# Patient Record
Sex: Male | Born: 1970 | Race: White | Hispanic: No | Marital: Single | Smoking: Never smoker
Health system: Southern US, Community
[De-identification: ages and names within clinical notes are randomized; demographics above are authoritative.]

## PROBLEM LIST (undated history)

## (undated) DIAGNOSIS — Z789 Other specified health status: Secondary | ICD-10-CM

## (undated) HISTORY — PX: NO PAST SURGERIES: SHX2092

---

## 2004-10-04 ENCOUNTER — Emergency Department: Payer: Self-pay | Admitting: Emergency Medicine

## 2017-07-26 ENCOUNTER — Emergency Department: Payer: Medicaid Other

## 2017-07-26 ENCOUNTER — Other Ambulatory Visit: Payer: Self-pay

## 2017-07-26 ENCOUNTER — Observation Stay
Admission: EM | Admit: 2017-07-26 | Discharge: 2017-07-28 | Disposition: A | Discharge status: Home / Self Care | Payer: Medicaid Other | Attending: Internal Medicine | Admitting: Internal Medicine

## 2017-07-26 ENCOUNTER — Encounter: Payer: Self-pay | Admitting: Emergency Medicine

## 2017-07-26 DIAGNOSIS — L03114 Cellulitis of left upper limb: Secondary | ICD-10-CM

## 2017-07-26 DIAGNOSIS — F17200 Nicotine dependence, unspecified, uncomplicated: Secondary | ICD-10-CM | POA: Insufficient documentation

## 2017-07-26 DIAGNOSIS — L03119 Cellulitis of unspecified part of limb: Secondary | ICD-10-CM

## 2017-07-26 DIAGNOSIS — L02414 Cutaneous abscess of left upper limb: Secondary | ICD-10-CM | POA: Diagnosis not present

## 2017-07-26 DIAGNOSIS — L02419 Cutaneous abscess of limb, unspecified: Secondary | ICD-10-CM | POA: Diagnosis present

## 2017-07-26 DIAGNOSIS — L0291 Cutaneous abscess, unspecified: Secondary | ICD-10-CM

## 2017-07-26 HISTORY — DX: Other specified health status: Z78.9

## 2017-07-26 LAB — BASIC METABOLIC PANEL
ANION GAP: 8 (ref 5–15)
BUN: 9 mg/dL (ref 6–20)
CALCIUM: 9.3 mg/dL (ref 8.9–10.3)
CO2: 28 mmol/L (ref 22–32)
Chloride: 100 mmol/L — ABNORMAL LOW (ref 101–111)
Creatinine, Ser: 0.83 mg/dL (ref 0.61–1.24)
Glucose, Bld: 91 mg/dL (ref 65–99)
Potassium: 4 mmol/L (ref 3.5–5.1)
Sodium: 136 mmol/L (ref 135–145)

## 2017-07-26 LAB — LACTIC ACID, PLASMA
Lactic Acid, Venous: 1.2 mmol/L (ref 0.5–1.9)
Lactic Acid, Venous: 1.7 mmol/L (ref 0.5–1.9)

## 2017-07-26 LAB — CBC WITH DIFFERENTIAL/PLATELET
BASOS ABS: 0 10*3/uL (ref 0–0.1)
BASOS PCT: 0 %
Eosinophils Absolute: 0.1 10*3/uL (ref 0–0.7)
Eosinophils Relative: 1 %
HCT: 44.9 % (ref 40.0–52.0)
Hemoglobin: 14.9 g/dL (ref 13.0–18.0)
Lymphocytes Relative: 17 %
Lymphs Abs: 1.9 10*3/uL (ref 1.0–3.6)
MCH: 31 pg (ref 26.0–34.0)
MCHC: 33.1 g/dL (ref 32.0–36.0)
MCV: 93.6 fL (ref 80.0–100.0)
MONO ABS: 1.4 10*3/uL — AB (ref 0.2–1.0)
Monocytes Relative: 12 %
NEUTROS PCT: 70 %
Neutro Abs: 8.1 10*3/uL — ABNORMAL HIGH (ref 1.4–6.5)
Platelets: 240 10*3/uL (ref 150–440)
RBC: 4.79 MIL/uL (ref 4.40–5.90)
RDW: 13.5 % (ref 11.5–14.5)
WBC: 11.5 10*3/uL — AB (ref 3.8–10.6)

## 2017-07-26 LAB — PROTIME-INR
INR: 0.8
PROTHROMBIN TIME: 11 s — AB (ref 11.4–15.2)

## 2017-07-26 LAB — FIBRINOGEN: FIBRINOGEN: 408 mg/dL (ref 210–475)

## 2017-07-26 LAB — APTT: aPTT: 32 seconds (ref 24–36)

## 2017-07-26 MED ORDER — SODIUM CHLORIDE 0.9 % IV BOLUS
1000.0000 mL | Freq: Once | INTRAVENOUS | Status: AC
  Administered 2017-07-26: 1000 mL via INTRAVENOUS

## 2017-07-26 MED ORDER — FENTANYL CITRATE (PF) 100 MCG/2ML IJ SOLN
50.0000 ug | Freq: Once | INTRAMUSCULAR | Status: AC
Start: 2017-07-26 — End: 2017-07-26
  Administered 2017-07-26: 50 ug via INTRAVENOUS
  Filled 2017-07-26: qty 2

## 2017-07-26 MED ORDER — CLINDAMYCIN PHOSPHATE 600 MG/50ML IV SOLN
600.0000 mg | Freq: Once | INTRAVENOUS | Status: AC
  Administered 2017-07-26: 600 mg via INTRAVENOUS
  Filled 2017-07-26 (×2): qty 50

## 2017-07-26 MED ORDER — TETANUS-DIPHTH-ACELL PERTUSSIS 5-2.5-18.5 LF-MCG/0.5 IM SUSP
0.5000 mL | Freq: Once | INTRAMUSCULAR | Status: AC
  Administered 2017-07-27: 0.5 mL via INTRAMUSCULAR
  Filled 2017-07-26: qty 0.5

## 2017-07-26 MED ORDER — MORPHINE SULFATE (PF) 4 MG/ML IV SOLN
4.0000 mg | Freq: Once | INTRAVENOUS | Status: AC
  Administered 2017-07-26: 4 mg via INTRAVENOUS
  Filled 2017-07-26: qty 1

## 2017-07-26 MED ORDER — LIDOCAINE HCL (PF) 1 % IJ SOLN
INTRAMUSCULAR | Status: AC
  Filled 2017-07-26: qty 5

## 2017-07-26 MED ORDER — ONDANSETRON HCL 4 MG/2ML IJ SOLN
4.0000 mg | Freq: Once | INTRAMUSCULAR | Status: AC
  Administered 2017-07-26: 4 mg via INTRAVENOUS
  Filled 2017-07-26: qty 2

## 2017-07-26 MED ORDER — LIDOCAINE HCL (PF) 1 % IJ SOLN
10.0000 mL | Freq: Once | INTRAMUSCULAR | Status: AC
  Administered 2017-07-26: 10 mL via INTRADERMAL

## 2017-07-26 NOTE — ED Triage Notes (Signed)
FIRST NURSE NOTE-here for wound/redness to left wrist.  Has red streak up to elbow.  Not had evaluated.  Has been tired today.

## 2017-07-26 NOTE — ED Provider Notes (Signed)
[facility] Emergency Department Provider Note  ____________________________________________  Time seen: Approximately 10:37 PM  I have reviewed the triage vital signs and the nursing notes.   HISTORY  Chief Complaint Insect Bite   HPI Michael [name] is a 47 y.o. male no significant past medical history who presents for evaluation of left upper extremity pain and swelling.  Patient reports that he was clearing the bushes around his house yesterday.  Later that evening he noted some swelling over the left wrist area.  Since then the area developed into an abscess.  He tried to drained at home yesterday. The swelling and erythema then started to spread down his hand and up his arm. Patient is complaining of constant severe swelling and moderate throbbing pain since earlier this morning. Patient has demarcated the redness area at home and upon arrival to the ED, the redness has spread significantly. The are was again demarcated in the waiting room and has spread before he was placed in the room.  Patient is right-handed.  He denies any known insect or snake bites.  He denies any fever, chills, nausea, vomiting.  PMH None  Meds None  Allergies Patient has no known allergies.  FH Not contributory  Social History Social History   Tobacco Use  . Smoking status: Current Some Day Smoker  . Smokeless tobacco: Never Used  Substance Use Topics  . Alcohol use: Not on file  . Drug use: Not on file    Review of Systems  Constitutional: Negative for fever. Eyes: Negative for visual changes. ENT: Negative for sore throat. Neck: No neck pain  Cardiovascular: Negative for chest pain. Respiratory: Negative for shortness of breath. Gastrointestinal: Negative for abdominal pain, vomiting or diarrhea. Genitourinary: Negative for dysuria. Musculoskeletal: Negative for back pain. + L arm swelling and pain Skin: Negative for rash. Neurological: Negative for  headaches, weakness or numbness. Psych: No SI or HI  ____________________________________________   PHYSICAL EXAM:  VITAL SIGNS: ED Triage Vitals [07/26/17 1935]  Enc Vitals Group     BP (!) 152/100     Pulse Rate 61     Resp 18     Temp 98.9 F (37.2 C)     Temp Source Oral     SpO2 99 %     Weight      Height      Head Circumference      Peak Flow      Pain Score 10     Pain Loc      Pain Edu?      Excl. in GC?     Constitutional: Alert and oriented. Well appearing and in no apparent distress. HEENT:      Head: Normocephalic and atraumatic.         Eyes: Conjunctivae are normal. Sclera is non-icteric.       Mouth/Throat: Mucous membranes are moist.       Neck: Supple with no signs of meningismus. Cardiovascular: Regular rate and rhythm. No murmurs, gallops, or rubs. 2+ symmetrical distal pulses are present in all extremities. No JVD. Respiratory: Normal respiratory effort. Lungs are clear to auscultation bilaterally. No wheezes, crackles, or rhonchi.  Gastrointestinal: Soft, non tender, and non distended with positive bowel sounds. No rebound or guarding. Musculoskeletal: There is an abscess located on the L wrist with significant overlying erythema spreading to the hand and up his arm. There is swelling of the dorsum of the hand Neurologic: Normal speech and language. Face is  symmetric. Moving all extremities. No gross focal neurologic deficits are appreciated. Skin: Skin is warm, dry and intact. No rash noted. Psychiatric: Mood and affect are normal. Speech and behavior are normal.  ____________________________________________   LABS (all labs ordered are listed, but only abnormal results are displayed)  Labs Reviewed  CBC WITH DIFFERENTIAL/PLATELET - Abnormal; Notable for the following components:      Result Value   WBC 11.5 (*)    Neutro Abs 8.1 (*)    Monocytes Absolute 1.4 (*)    All other components within normal limits  BASIC METABOLIC PANEL -  Abnormal; Notable for the following components:   Chloride 100 (*)    All other components within normal limits  PROTIME-INR - Abnormal; Notable for the following components:   Prothrombin Time 11.0 (*)    All other components within normal limits  CULTURE, BLOOD (ROUTINE X 2)  CULTURE, BLOOD (ROUTINE X 2)  LACTIC ACID, PLASMA  LACTIC ACID, PLASMA  APTT  FIBRINOGEN   ____________________________________________  EKG  none  ____________________________________________  RADIOLOGY  I have personally reviewed the images performed during this visit and I agree with the Radiologist's read.   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 EXAM: LEFT WRIST - COMPLETE 3+ VIEW COMPARISON:  None. FINDINGS: There is no evidence of fracture or dislocation. There is no evidence of arthropathy or other focal bone abnormality. Mild swelling of the wrist soft tissues. IMPRESSION: No acute bony or articular abnormality identified. Mild swelling of the wrist soft tissues. Electronically Signed   By: Mitzi HansenLance  Furusawa-Stratton M.D.   On: 07/26/2017 22:25   ____________________________________________   PROCEDURES  Procedure(s) performed:yes .Marland Kitchen.Incision and Drainage Date/Time: 07/26/2017 10:53 PM Performed by: Nita SickleVeronese, [place], MD Authorized by: Nita SickleVeronese, [place], MD   Consent:    Consent obtained:  Verbal   Consent given by:  Patient   Risks discussed:  Bleeding, infection, incomplete drainage and pain   Alternatives discussed:  Alternative treatment, delayed treatment and observation Location:    Type:  Abscess   Size:  1   Location:  Upper extremity   Upper extremity location:  Wrist   Wrist location:  L wrist Pre-procedure details:    Skin preparation:  Chloraprep Anesthesia (see MAR for exact dosages):     Anesthesia method:  Local infiltration   Local anesthetic:  Lidocaine 1% w/o epi Procedure type:    Complexity:  Complex Procedure details:    Incision types:  Stab incision   Scalpel blade:  11   Wound management:  Probed and deloculated and irrigated with saline   Drainage:  Purulent   Drainage amount:  Moderate   Wound treatment:  Wound left open   Packing materials:  None Post-procedure details:    Patient tolerance of procedure:  Tolerated well, no immediate complications   Critical Care performed:  None ____________________________________________   INITIAL IMPRESSION / ASSESSMENT AND PLAN / ED COURSE   47 y.o. male no significant past medical history who presents for evaluation of left upper extremity pain and swelling.  Patient with an abscess located over the left wrist with significant swelling, warmth, and erythema extending distally to the fingers and proximally to the forearm.  The area has expanded outside the demarcated area which was done in triage upon arrival at 7:30PM. The abscess was I&D per procedure  note above.  No tachycardia or fever, mild leukocytosis with white count of 11.5.  Normal lactic acid.  Blood cultures were sent. Will give IV clindamycin and vancomycin.  At this time since patient is otherwise healthy with normal vitals and normal labs and the abscess has been drained.  I believe patient may be safe for discharge home on oral clindamycin.  However we are still waiting for pharmacy to send antibiotics.  Will reevaluate the wound after antibiotics have been given.  If continues to expand outside of the demarcated area or if patient spikes a fever will get him admitted for IV antibiotics otherwise will plan to discharge home on p.o. antibiotics and close follow-up.    _________________________ 12:11 AM on 07/27/2017 -----------------------------------------  Patient continues to have significant pain after 3 rounds of opiates.  Will admit for pain control,  IV antibiotics, close monitoring.   As part of my medical decision making, I reviewed the following data within the electronic MEDICAL RECORD NUMBER Nursing notes reviewed and incorporated, Labs reviewed , Radiograph reviewed , Discussed with admitting physician , Notes from prior ED visits and [place] Controlled Substance Database    Pertinent labs & imaging results that were available during my care of the patient were reviewed by me and considered in my medical decision making (see chart for details).    ____________________________________________   FINAL CLINICAL IMPRESSION(S) / ED DIAGNOSES  Final diagnoses:  Abscess  Cellulitis of left upper extremity      NEW MEDICATIONS STARTED DURING THIS VISIT:  ED Discharge Orders    None       Note:  This document was prepared using Dragon voice recognition software and may include unintentional dictation errors.    Don Perking, [place], MD 07/27/17 [phone]

## 2017-07-26 NOTE — ED Triage Notes (Signed)
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

## 2017-07-26 NOTE — ED Notes (Signed)
Pharmacy emailed to send cleocin 

## 2017-07-27 ENCOUNTER — Encounter: Payer: Self-pay | Admitting: Internal Medicine

## 2017-07-27 ENCOUNTER — Other Ambulatory Visit: Payer: Self-pay

## 2017-07-27 DIAGNOSIS — L03119 Cellulitis of unspecified part of limb: Secondary | ICD-10-CM

## 2017-07-27 DIAGNOSIS — L02419 Cutaneous abscess of limb, unspecified: Secondary | ICD-10-CM | POA: Diagnosis present

## 2017-07-27 LAB — BASIC METABOLIC PANEL
Anion gap: 7 (ref 5–15)
BUN: 7 mg/dL (ref 6–20)
CHLORIDE: 104 mmol/L (ref 101–111)
CO2: 25 mmol/L (ref 22–32)
Calcium: 8.4 mg/dL — ABNORMAL LOW (ref 8.9–10.3)
Creatinine, Ser: 0.78 mg/dL (ref 0.61–1.24)
GFR calc Af Amer: >60 mL/min (ref >60)
GLUCOSE: 153 mg/dL — AB (ref 65–99)
Potassium: 4 mmol/L (ref 3.5–5.1)
SODIUM: 136 mmol/L (ref 135–145)

## 2017-07-27 LAB — CBC
HCT: 43.4 % (ref 40.0–52.0)
Hemoglobin: 14.5 g/dL (ref 13.0–18.0)
MCH: 31.9 pg (ref 26.0–34.0)
MCHC: 33.4 g/dL (ref 32.0–36.0)
MCV: 95.4 fL (ref 80.0–100.0)
PLATELETS: 212 10*3/uL (ref 150–440)
RBC: 4.55 MIL/uL (ref 4.40–5.90)
RDW: 13.5 % (ref 11.5–14.5)
WBC: 9.8 10*3/uL (ref 3.8–10.6)

## 2017-07-27 MED ORDER — OXYCODONE HCL 5 MG PO TABS
5.0000 mg | ORAL_TABLET | ORAL | Status: DC | PRN
  Administered 2017-07-27 – 2017-07-28 (×3): 5 mg via ORAL
  Filled 2017-07-27 (×3): qty 1

## 2017-07-27 MED ORDER — ACETAMINOPHEN 325 MG PO TABS
650.0000 mg | ORAL_TABLET | Freq: Four times a day (QID) | ORAL | Status: DC | PRN
Start: 2017-07-27 — End: 2017-07-28
  Administered 2017-07-27 – 2017-07-28 (×2): 650 mg via ORAL
  Filled 2017-07-27 (×2): qty 2

## 2017-07-27 MED ORDER — VANCOMYCIN HCL IN DEXTROSE 1-5 GM/200ML-% IV SOLN
1000.0000 mg | Freq: Three times a day (TID) | INTRAVENOUS | Status: DC
  Administered 2017-07-27 – 2017-07-28 (×4): 1000 mg via INTRAVENOUS
  Filled 2017-07-27 (×5): qty 200

## 2017-07-27 MED ORDER — VANCOMYCIN HCL IN DEXTROSE 1-5 GM/200ML-% IV SOLN
1000.0000 mg | Freq: Once | INTRAVENOUS | Status: AC
  Administered 2017-07-27: 1000 mg via INTRAVENOUS
  Filled 2017-07-27: qty 200

## 2017-07-27 MED ORDER — ACETAMINOPHEN 650 MG RE SUPP: 650.0000 mg | Freq: Four times a day (QID) | RECTAL | Status: DC | PRN

## 2017-07-27 MED ORDER — ONDANSETRON HCL 4 MG/2ML IJ SOLN: 4.0000 mg | Freq: Four times a day (QID) | INTRAMUSCULAR | Status: DC | PRN

## 2017-07-27 MED ORDER — MORPHINE SULFATE (PF) 4 MG/ML IV SOLN
4.0000 mg | INTRAVENOUS | Status: DC | PRN
  Administered 2017-07-27 (×5): 4 mg via INTRAVENOUS
  Filled 2017-07-27 (×5): qty 1

## 2017-07-27 MED ORDER — ONDANSETRON HCL 4 MG PO TABS
4.0000 mg | ORAL_TABLET | Freq: Four times a day (QID) | ORAL | Status: DC | PRN
Start: 2017-07-27 — End: 2017-07-28

## 2017-07-27 MED ORDER — ENOXAPARIN SODIUM 40 MG/0.4ML ~~LOC~~ SOLN
40.0000 mg | SUBCUTANEOUS | Status: DC
  Administered 2017-07-27 – 2017-07-28 (×2): 40 mg via SUBCUTANEOUS
  Filled 2017-07-27 (×2): qty 0.4

## 2017-07-27 NOTE — H&P (Signed)
[facility] - [place] at [facility]   PATIENT NAME: Michael [name]    MR#:  [id number]  DATE OF BIRTH:  16-May-1970  DATE OF ADMISSION:  07/26/2017  PRIMARY CARE PHYSICIAN: Patient, No Pcp Per   REQUESTING/REFERRING PHYSICIAN: Don Perking, MD  CHIEF COMPLAINT:   Chief Complaint  Patient presents with  . Insect Bite    HISTORY OF PRESENT ILLNESS:  Michael [name]  is a 47 y.o. male who presents with erythema and swelling to his left upper extremity that started about 36 hours ago.  This progressed until today he had significant pain and tenderness and swelling and came to the ED for evaluation.  ED physician was able to lance an abscess, IV antibiotics started and hospitalist called for admission  PAST MEDICAL HISTORY:   Past Medical History:  Diagnosis Date  . Patient denies medical problems      PAST SURGICAL HISTORY:   Past Surgical History:  Procedure Laterality Date  . NO PAST SURGERIES       SOCIAL HISTORY:   Social History   Tobacco Use  . Smoking status: Current Some Day Smoker  . Smokeless tobacco: Never Used  Substance Use Topics  . Alcohol use: Not on file     FAMILY HISTORY:  Family history reviewed and is noncontributory  DRUG ALLERGIES:  No Known Allergies  MEDICATIONS AT HOME:   Prior to Admission medications   Not on File    REVIEW OF SYSTEMS:  Review of Systems  Constitutional: Negative for chills, fever, malaise/fatigue and weight loss.  HENT: Negative for ear pain, hearing loss and tinnitus.   Eyes: Negative for blurred vision, double vision, pain and redness.  Respiratory: Negative for cough, hemoptysis and shortness of breath.   Cardiovascular: Negative for chest pain, palpitations, orthopnea and leg swelling.  Gastrointestinal: Negative for abdominal pain, constipation, diarrhea, nausea and vomiting.  Genitourinary: Negative for dysuria, frequency and hematuria.  Musculoskeletal: Negative for back  pain, joint pain and neck pain.  Skin:       Erythema, tenderness, abscess left upper extremity  Neurological: Negative for dizziness, tremors, focal weakness and weakness.  Endo/Heme/Allergies: Negative for polydipsia. Does not bruise/bleed easily.  Psychiatric/Behavioral: Negative for depression. The patient is not nervous/anxious and does not have insomnia.      VITAL SIGNS:   Vitals:   07/27/17 0015 07/27/17 0030 07/27/17 0045 07/27/17 0100  BP:  116/73  (!) 129/93  Pulse: 82 81 76 83  Resp:      Temp:      TempSrc:      SpO2: 93% 93% 93% 94%   Wt Readings from Last 3 Encounters:  No data found for Wt    PHYSICAL EXAMINATION:  Physical Exam  Vitals reviewed. Constitutional: He is oriented to person, place, and time. He appears well-developed and well-nourished. No distress.  HENT:  Head: Normocephalic and atraumatic.  Mouth/Throat: Oropharynx is clear and moist.  Eyes: Pupils are equal, round, and reactive to light. Conjunctivae and EOM are normal. No scleral icterus.  Neck: Normal range of motion. Neck supple. No JVD present. No thyromegaly present.  Cardiovascular: Normal rate, regular rhythm and intact distal pulses. Exam reveals no gallop and no friction rub.  No murmur heard. Respiratory: Effort normal and breath sounds normal. No respiratory distress. He has no wheezes. He has no rales.  GI: Soft. Bowel sounds are normal. He exhibits no distension. There is no tenderness.  Musculoskeletal: Normal range of motion. He exhibits no edema.  No arthritis, no gout  Lymphadenopathy:    He has no cervical adenopathy.  Neurological: He is alert and oriented to person, place, and time. No cranial nerve deficit.  No dysarthria, no aphasia  Skin: Skin is warm and dry. No rash noted. There is erythema (Left upper extremity prominent around the hand and tracking up his arm).  Psychiatric: He has a normal mood and affect. His behavior is normal. Judgment and thought content  normal.    LABORATORY PANEL:   CBC Recent Labs  Lab 07/26/17 1946  WBC 11.5*  HGB 14.9  HCT 44.9  PLT 240   ------------------------------------------------------------------------------------------------------------------  Chemistries  Recent Labs  Lab 07/26/17 1946  NA 136  K 4.0  CL 100*  CO2 28  GLUCOSE 91  BUN 9  CREATININE 0.83  CALCIUM 9.3   ------------------------------------------------------------------------------------------------------------------  Cardiac Enzymes No results for input(s): TROPONINI in the last 168 hours. 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 EXAM: LEFT WRIST - COMPLETE 3+ VIEW COMPARISON:  None. FINDINGS: There is no evidence of fracture or dislocation. There is no evidence of arthropathy or other focal bone abnormality. Mild swelling of the wrist soft tissues. IMPRESSION: No acute bony or articular abnormality identified. Mild swelling of the wrist soft tissues. Electronically Signed   By: Mitzi HansenLance  Furusawa-Stratton M.D.   On: 07/26/2017 22:25    EKG:  No orders found for this or any previous visit.  IMPRESSION AND PLAN:  Principal Problem:   Cellulitis and abscess of upper extremity -abscess lanced, IV antibiotics started, PRN analgesia  Chart review performed and case discussed with ED provider. Labs, imaging and/or ECG reviewed by provider and discussed with patient/family. Management plans discussed with the patient and/or family.  DVT PROPHYLAXIS: SubQ lovenox  GI PROPHYLAXIS: None  ADMISSION STATUS: Observation  CODE STATUS: Full  TOTAL TIME TAKING CARE OF THIS PATIENT: 40 minutes.   Michael [name] 07/27/2017, 1:16 AM  [facility]  Office  [phone]  CC: Primary care physician; Patient, No Pcp Per  Note:  This document was prepared using Dragon voice recognition software and may include unintentional dictation errors.

## 2017-07-27 NOTE — Progress Notes (Signed)
Patient seen and evaluated by me today Reviewed history and physical by Dr. Oralia Manisavid Willis Patient currently on IV vancomycin antibiotic had incision and drainage Of the left upper extremity abscess. Continue IV antibiotics continue pain medications with oxycodone and as needed IV morphine Discussed with patient treatment plan and medical condition in detail.

## 2017-07-27 NOTE — Progress Notes (Signed)
Advanced care plan.  Purpose of the Encounter: CODE STATUS  Parties in Attendance: Patient  Patient's Decision Capacity:Good  Subjective/Patient's story: Admitted for cellulitis of fore arm and arm   Objective/Medical story I&D done in ER On IV antibiotics  Goals of care determination:  Advance directives discussed Patient wants everything done : cardiac resuscitation, intubation and ventilator if need arises  CODE STATUS: Full code   Time spent discussing advanced care planning: 16 minutes

## 2017-07-27 NOTE — Progress Notes (Signed)
Pharmacy Antibiotic Note  Michael [name] is a 47 y.o. male admitted on 07/26/2017 with cellulitis.  Pharmacy has been consulted for vancomcyin dosing.  Plan: Patient received vanc 1g IV x 1   Will continue w/ vanc 1g IV q8h w/ 6 hour stack Will draw vanc trough 04/10 @ 0600 prior to 4th dose.  Ke 0.108 T1/2 8 hrs Goal trough 15 - 20 mcg/mL d/t erythema and swelling to LUE s/t insect bite  Height: 6\' 2"  (188 cm) Weight: 186 lb (84.4 kg) IBW/kg (Calculated) : 82.2  Temp (24hrs), Avg:98.8 F (37.1 C), Min:98.7 F (37.1 C), Max:98.9 F (37.2 C)  Recent Labs  Lab 07/26/17 1946 07/26/17 2128 07/27/17 0450  WBC 11.5*  --  9.8  CREATININE 0.83  --  0.78  LATICACIDVEN 1.2 1.7  --     Estimated Creatinine Clearance: 131.3 mL/min (by C-G formula based on SCr of 0.78 mg/dL).    No Known Allergies  Thank you for allowing pharmacy to be a part of this patient's care.  Thomasene Rippleavid Nyair Depaulo, PharmD, BCPS Clinical Pharmacist 07/27/2017

## 2017-07-28 LAB — HIV ANTIBODY (ROUTINE TESTING W REFLEX): HIV SCREEN 4TH GENERATION: NONREACTIVE

## 2017-07-28 LAB — VANCOMYCIN, TROUGH: VANCOMYCIN TR: 12 ug/mL — AB (ref 15–20)

## 2017-07-28 MED ORDER — SODIUM CHLORIDE 0.9 % IV SOLN
1250.0000 mg | Freq: Three times a day (TID) | INTRAVENOUS | Status: DC
  Administered 2017-07-28: 1250 mg via INTRAVENOUS
  Filled 2017-07-28 (×4): qty 1250

## 2017-07-28 MED ORDER — CLINDAMYCIN HCL 300 MG PO CAPS: 300.0000 mg | ORAL_CAPSULE | Freq: Three times a day (TID) | ORAL | 0 refills | Status: AC

## 2017-07-28 MED ORDER — OXYCODONE-ACETAMINOPHEN 5-325 MG PO TABS
1.0000 | ORAL_TABLET | Freq: Four times a day (QID) | ORAL | 0 refills | Status: AC | PRN
Start: 2017-07-28 — End: 2017-08-02

## 2017-07-28 NOTE — Discharge Summary (Signed)
[facility] - [place] at [facility], [place] y.o., DOB [birth year], MRN [id number]. Admission date: 07/26/2017 Discharge Date 07/28/2017 Primary MD Patient, No Pcp Per Admitting Physician Michael Manis, MD  Admission Diagnosis   1.  Cellulitis and abscess of left upper extremity 2.  Left upper extremity pain 3.  Tobacco abuse  Discharge Diagnosis        1.  Cellulitis of the left upper extremity improved 2.  Left upper extremity abscess status post incision and drainage 3.  Tobacco cessation counseled to the patient Hospital Course  47 year old male patient with history of smoking was admitted for left upper extremity swelling and redness.  Patient had left upper extremity cellulitis and abscess.  He had incision and drainage done in the emergency room.  He was put on IV vancomycin antibiotic.  Patient received oral oxycodone for pain.  His HIV test is negative.  Blood cultures did not reveal any growth.  W BC count is normal at the time of discharge.  Patient tolerated IV antibiotics well.  Will be discharged home on oral clindamycin antibiotic.  Patient will continue oral Percocet as needed for pain.  Follow-up with primary care physician in the clinic tobacco cessation counseled to the patient for 6 minutes.  Discharge harmful effects of smoking also explained.  Nicotine patch offered.  Patient was worked up with left upper extremity x-rays which showed no fracture.  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 EXAM: LEFT WRIST - COMPLETE 3+ VIEW COMPARISON:  None. FINDINGS: There is no evidence of fracture or dislocation. There is no evidence of arthropathy or other focal bone abnormality. Mild swelling of the wrist soft tissues. IMPRESSION: No  acute bony or articular abnormality identified. Mild swelling of the wrist soft tissues. Electronically Signed   By: Michael [name] M.D.   On: 07/26/2017 22:25       Today   Subjective:   Michael [name] was seen and evaluated by me on the day of discharge. Decreased pain in the left upper extremity and left forearm and wrist No fever and chills No chest pain No shortness of breath  Objective:   Blood pressure 133/85, pulse 79, temperature 98.7 F (37.1 C), temperature source Oral, resp. rate 19, height 6\' 2"  (1.88 m), weight 84.4 kg (186 lb), SpO2 97 %.  .  Intake/Output Summary (Last 24 hours) at 07/28/2017 1544 Last data filed at 07/28/2017 0027 Gross per 24 hour  Intake 640 ml  Output -  Net 640 ml    Exam VITAL SIGNS: Blood pressure 133/85, pulse 79, temperature 98.7 F (37.1 C), temperature source Oral, resp. rate 19, height 6\' 2"  (1.88 m), weight 84.4 kg (186 lb), SpO2 97 %.  GENERAL:  47 y.o.-year-old patient lying in the bed with no acute distress.  EYES: Pupils equal, round, reactive to light and accommodation. No scleral icterus. Extraocular muscles intact.  HEENT: Head atraumatic, normocephalic. Oropharynx and nasopharynx clear.  NECK:  Supple, no jugular venous distention. No thyroid enlargement, no tenderness.  LUNGS: Normal breath sounds bilaterally, no wheezing, rales,rhonchi or crepitation. No use of accessory muscles of respiration.  CARDIOVASCULAR: S1, S2 normal. No murmurs, rubs, or gallops.  ABDOMEN: Soft, nontender, nondistended. Bowel sounds present. No organomegaly or  mass.  EXTREMITIES: No pedal edema, cyanosis, or clubbing.  Decreased redness in the left upper extremity Decreased swelling and tenderness in the left upper extremity around the wrist and left forearm Range of motion intact in the left wrist elbow NEUROLOGIC: Cranial nerves II through XII are intact. Muscle strength 5/5 in all extremities. Sensation intact. Gait not checked.   PSYCHIATRIC: The patient is alert and oriented x 3.  SKIN: No obvious rash, lesion, or ulcer.   Data Review     CBC w Diff:  Lab Results  Component Value Date   WBC 9.8 07/27/2017   HGB 14.5 07/27/2017   HCT 43.4 07/27/2017   PLT 212 07/27/2017   LYMPHOPCT 17 07/26/2017   MONOPCT 12 07/26/2017   EOSPCT 1 07/26/2017   BASOPCT 0 07/26/2017   CMP:  Lab Results  Component Value Date   NA 136 07/27/2017   K 4.0 07/27/2017   CL 104 07/27/2017   CO2 25 07/27/2017   BUN 7 07/27/2017   CREATININE 0.78 07/27/2017  .  Micro Results Recent Results (from the past 240 hour(s))  Blood culture (routine x 2)     Status: None (Preliminary result)   Collection Time: 07/26/17  7:46 PM  Result Value Ref Range Status   Specimen Description BLOOD RIGHT ANTECUBITAL  Final   Special Requests   Final    BOTTLES DRAWN AEROBIC AND ANAEROBIC Blood Culture results may not be optimal due to an excessive volume of blood received in culture bottles   Culture   Final    NO GROWTH 2 DAYS Performed at [facility], [address] [place], [place] [address]    Report Status PENDING  Incomplete  Blood culture (routine x 2)     Status: None (Preliminary result)   Collection Time: 07/26/17  9:28 PM  Result Value Ref Range Status   Specimen Description BLOOD BLOOD RIGHT FOREARM  Final   Special Requests   Final    BOTTLES DRAWN AEROBIC AND ANAEROBIC Blood Culture results may not be optimal due to an excessive volume of blood received in culture bottles   Culture   Final    NO GROWTH 2 DAYS Performed at [facility], [address], [place], [place] [address]    Report Status PENDING  Incomplete        Code Status Orders  (From admission, onward)        Start     Ordered   07/27/17 0227  Full code  Continuous     07/27/17 0226    Code Status History    This patient has a current code status but no historical code status.          Follow-up Information     Center, [facility] In 1 week.   Specialty:  General Practice Why:  Patient to Call for Appt Contact information: [address] [place] [address] [phone]           Discharge Medications   Allergies as of 07/28/2017   No Known Allergies     Medication List    TAKE these medications   clindamycin 300 MG capsule Commonly known as:  CLEOCIN Take 1 capsule (300 mg total) by mouth 3 (three) times daily for 7 days.   oxyCODONE-acetaminophen 5-325 MG tablet Commonly known as:  PERCOCET Take 1 tablet by mouth every 6 (six) hours as needed for up to 5 days for severe pain.  Total Time in preparing paper work, data evaluation and todays exam - 35 minutes  Ihor AustinPavan George Haggart M.D on 07/28/2017 at 3:44 PM [facility]   Office  [phone]

## 2017-07-28 NOTE — Progress Notes (Signed)
Pharmacy Antibiotic Note  Michael LovingsRobert J [name] is a 47 y.o. male admitted on 07/26/2017 with cellulitis.  Pharmacy has been consulted for vancomcyin dosing.  Plan: Patient received vanc 1g IV x 1   Will continue w/ vanc 1g IV q8h w/ 6 hour stack Will draw vanc trough 04/10 @ 0600 prior to 4th dose.  Ke 0.108 T1/2 8 hrs Goal trough 15 - 20 mcg/mL d/t erythema and swelling to LUE s/t insect bite  04/10 @ 0500 VT 12 subtherapeutic from goal. Will increase dose to vanc 1.25g IV q8h to start @ 1500, will recheck VT @ 04/11 @ 1400 prior to 4th dose. Css 15 mcg/mL  Height: 6\' 2"  (188 cm) Weight: 186 lb (84.4 kg) IBW/kg (Calculated) : 82.2  Temp (24hrs), Avg:99.8 F (37.7 C), Min:98.8 F (37.1 C), Max:101 F (38.3 C)  Recent Labs  Lab 07/26/17 1946 07/26/17 2128 07/27/17 0450 07/28/17 0510  WBC 11.5*  --  9.8  --   CREATININE 0.83  --  0.78  --   LATICACIDVEN 1.2 1.7  --   --   VANCOTROUGH  --   --   --  12*    Estimated Creatinine Clearance: 131.3 mL/min (by C-G formula based on SCr of 0.78 mg/dL).    No Known Allergies  Thank you for allowing pharmacy to be a part of this patient's care.  Thomasene Rippleavid Kaylum Shrum, PharmD, BCPS Clinical Pharmacist 07/28/2017

## 2017-07-28 NOTE — Progress Notes (Signed)
Patient verbalized understanding of discharge instructions and received all prescriptions. Pt ambulatory and afebrile at discharge.

## 2017-07-31 LAB — CULTURE, BLOOD (ROUTINE X 2)
CULTURE: NO GROWTH
Culture: NO GROWTH

## 2018-01-30 ENCOUNTER — Emergency Department
Admission: EM | Admit: 2018-01-30 | Discharge: 2018-01-31 | Disposition: A | Discharge status: Other Acute Inpatient Hospital | Payer: Medicaid Other | Attending: Emergency Medicine | Admitting: Emergency Medicine

## 2018-01-30 ENCOUNTER — Encounter: Payer: Self-pay | Admitting: Emergency Medicine

## 2018-01-30 ENCOUNTER — Other Ambulatory Visit: Payer: Self-pay

## 2018-01-30 DIAGNOSIS — F12188 Cannabis abuse with other cannabis-induced disorder: Secondary | ICD-10-CM | POA: Diagnosis not present

## 2018-01-30 DIAGNOSIS — F17200 Nicotine dependence, unspecified, uncomplicated: Secondary | ICD-10-CM | POA: Diagnosis not present

## 2018-01-30 DIAGNOSIS — F29 Unspecified psychosis not due to a substance or known physiological condition: Secondary | ICD-10-CM

## 2018-01-30 DIAGNOSIS — R440 Auditory hallucinations: Secondary | ICD-10-CM | POA: Insufficient documentation

## 2018-01-30 DIAGNOSIS — F10950 Alcohol use, unspecified with alcohol-induced psychotic disorder with delusions: Secondary | ICD-10-CM | POA: Diagnosis not present

## 2018-01-30 DIAGNOSIS — Z046 Encounter for general psychiatric examination, requested by authority: Secondary | ICD-10-CM | POA: Diagnosis present

## 2018-01-30 LAB — COMPREHENSIVE METABOLIC PANEL
ALBUMIN: 4.7 g/dL (ref 3.5–5.0)
ALT: 19 U/L (ref 0–44)
AST: 25 U/L (ref 15–41)
Alkaline Phosphatase: 59 U/L (ref 38–126)
Anion gap: 7 (ref 5–15)
BUN: 15 mg/dL (ref 6–20)
CO2: 30 mmol/L (ref 22–32)
Calcium: 9.4 mg/dL (ref 8.9–10.3)
Chloride: 102 mmol/L (ref 98–111)
Creatinine, Ser: 0.98 mg/dL (ref 0.61–1.24)
GFR calc Af Amer: >60 mL/min (ref >60)
GFR calc non Af Amer: >60 mL/min (ref >60)
Glucose, Bld: 96 mg/dL (ref 70–99)
POTASSIUM: 4 mmol/L (ref 3.5–5.1)
Sodium: 139 mmol/L (ref 135–145)
Total Bilirubin: 0.6 mg/dL (ref 0.3–1.2)
Total Protein: 7.2 g/dL (ref 6.5–8.1)

## 2018-01-30 LAB — CBC
HEMATOCRIT: 39.6 % (ref 39.0–52.0)
HEMOGLOBIN: 14 g/dL (ref 13.0–17.0)
MCH: 32.9 pg (ref 26.0–34.0)
MCHC: 35.4 g/dL (ref 30.0–36.0)
MCV: 93.2 fL (ref 80.0–100.0)
Platelets: 269 10*3/uL (ref 150–400)
RBC: 4.25 MIL/uL (ref 4.22–5.81)
RDW: 12.5 % (ref 11.5–15.5)
WBC: 8.7 10*3/uL (ref 4.0–10.5)
nRBC: 0 % (ref 0.0–0.2)

## 2018-01-30 LAB — URINE DRUG SCREEN, QUALITATIVE (ARMC ONLY)
AMPHETAMINES, UR SCREEN: POSITIVE — AB
Barbiturates, Ur Screen: NOT DETECTED
Benzodiazepine, Ur Scrn: NOT DETECTED
Cannabinoid 50 Ng, Ur ~~LOC~~: POSITIVE — AB
Cocaine Metabolite,Ur ~~LOC~~: NOT DETECTED
MDMA (ECSTASY) UR SCREEN: NOT DETECTED
METHADONE SCREEN, URINE: NOT DETECTED
Opiate, Ur Screen: NOT DETECTED
PHENCYCLIDINE (PCP) UR S: NOT DETECTED
TRICYCLIC, UR SCREEN: NOT DETECTED

## 2018-01-30 LAB — ETHANOL

## 2018-01-30 LAB — SALICYLATE LEVEL

## 2018-01-30 LAB — ACETAMINOPHEN LEVEL

## 2018-01-30 NOTE — ED Notes (Addendum)
Pt dressed out in burgundy scrubs by Allayne Stack, EDT with this RN and Guido Sander present. Blue T shirt, orange and grey baseball cap, brown belt, blue jeans, boxer briefs taken and placed in belongings bag, labeled and taken to [facility] locker room.

## 2018-01-30 NOTE — ED Notes (Signed)
Patient states he smoked som "weed" and drank some alcohol.

## 2018-01-30 NOTE — ED Provider Notes (Addendum)
[facility] Emergency Department Provider Note  ____________________________________________   First MD Initiated Contact with Patient 01/30/18 2240     (approximate)  I have reviewed the triage vital signs and the nursing notes.   HISTORY  Chief Complaint Other (IVC)  Level 5 exemption history limited by the patient's clinical condition  HPI Michael [name] is a 47 y.o. male who comes to the emergency department on an involuntary commitment.  According to the commitment the patient was drinking alcohol and smoking marijuana today and became increasingly confused and paranoid and seemed to have auditory hallucinations.  The patient himself is unable to provide any meaningful history as he only wants to turn over and go to sleep.    Past Medical History:  Diagnosis Date  . Patient denies medical problems     Patient Active Problem List   Diagnosis Date Noted  . Cellulitis and abscess of upper extremity 07/27/2017    Past Surgical History:  Procedure Laterality Date  . NO PAST SURGERIES      Prior to Admission medications   Not on File    Allergies Patient has no known allergies.  History reviewed. No pertinent family history.  Social History Social History   Tobacco Use  . Smoking status: Current Some Day Smoker  . Smokeless tobacco: Never Used  Substance Use Topics  . Alcohol use: Not on file  . Drug use: Not on file    Review of Systems Level 5 exemption history limited by the patient's clinical condition ____________________________________________   PHYSICAL EXAM:  VITAL SIGNS: ED Triage Vitals  Enc Vitals Group     BP 01/30/18 2210 106/87     Pulse Rate 01/30/18 2210 (!) 128     Resp 01/30/18 2210 18     Temp 01/30/18 2210 99.2 F (37.3 C)     Temp Source 01/30/18 2210 Oral     SpO2 01/30/18 2210 97 %     Weight 01/30/18 2211 172 lb (78 kg)     Height 01/30/18 2211 6\' 2"  (1.88 m)     Head Circumference --        Peak Flow --      Pain Score 01/30/18 2211 7     Pain Loc --      Pain Edu? --      Excl. in GC? --     Constitutional: Very strange and bizarre behavior.  Appears paranoid and confused Eyes: PERRL EOMI. midrange and brisk Head: Atraumatic. Nose: No congestion/rhinnorhea. Mouth/Throat: No trismus Neck: No stridor.   Cardiovascular: Tachycardic rate, regular rhythm. Grossly normal heart sounds.  Good peripheral circulation. Respiratory: Normal respiratory effort.  No retractions. Lungs CTAB and moving good air Gastrointestinal: Soft nontender Musculoskeletal: No lower extremity edema   Neurologic:  Normal speech and language. No gross focal neurologic deficits are appreciated. Skin:  Skin is warm, dry and intact. No rash noted. Psychiatric: Paranoid    ____________________________________________   DIFFERENTIAL includes but not limited to  Schizophrenia, drug overdose, bipolar disorder, encephalopathy ____________________________________________   LABS (all labs ordered are listed, but only abnormal results are displayed)  Labs Reviewed  URINE DRUG SCREEN, QUALITATIVE ([facility] ONLY) - Abnormal; Notable for the following components:      Result Value   Amphetamines, Ur Screen POSITIVE (*)    Cannabinoid 50 Ng, Ur Gervais POSITIVE (*)    All other components within normal limits  ACETAMINOPHEN LEVEL - Abnormal; Notable for the following components:   Acetaminophen (  Tylenol), Serum <10 (*)    All other components within normal limits  COMPREHENSIVE METABOLIC PANEL  ETHANOL  CBC  SALICYLATE LEVEL    Lab work reviewed by me shows the patient is amphetamine and cannabis positive __________________________________________  EKG  ED ECG REPORT I, Merrily Brittle, the attending physician, personally viewed and interpreted this ECG.  Date: 01/31/2018 EKG Time:  Rate: 116 Rhythm: Sinus tachycardia QRS Axis: normal Intervals: normal ST/T Wave abnormalities:  normal Narrative Interpretation: no evidence of acute ischemia  ____________________________________________  RADIOLOGY   ____________________________________________   PROCEDURES  Procedure(s) performed: no  Procedures  Critical Care performed: no  ____________________________________________   INITIAL IMPRESSION / ASSESSMENT AND PLAN / ED COURSE  Pertinent labs & imaging results that were available during my care of the patient were reviewed by me and considered in my medical decision making (see chart for details).   As part of my medical decision making, I reviewed the following data within the electronic MEDICAL RECORD NUMBER History obtained from family if available, nursing notes, old chart and ekg, as well as notes from prior ED visits.  Patient is demonstrating bizarre behavior and is convinced that [employer] is trying to kill him.  I am upholding his involuntary commitment.  He does not require medication at this point.  The patient's lab work is back showing he is positive for amphetamines.  Psychiatric evaluation remains pending.      ____________________________________________   FINAL CLINICAL IMPRESSION(S) / ED DIAGNOSES  Final diagnoses:  Psychosis, unspecified psychosis type (HCC)      NEW MEDICATIONS STARTED DURING THIS VISIT:  New Prescriptions   No medications on file     Note:  This document was prepared using Dragon voice recognition software and may include unintentional dictation errors.     Merrily Brittle, MD 01/31/18 1308    Merrily Brittle, MD 01/31/18 [phone]
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
## 2018-01-30 NOTE — ED Notes (Signed)
TTS in room with patient. 

## 2018-01-30 NOTE — ED Notes (Signed)
Pt. Transferred from Triage to room 22 after dressing out and screening for contraband. Report to include Situation, Background, Assessment and Recommendations from [facility]. Pt. Oriented to Quad including Q15 minute rounds as well as Psychologist, counselling for their protection. Patient is alert and oriented, warm and dry in no acute distress. Patient denies SI, HI, and AVH but appears to be paranoid and fear of his life, states [employer] are after him.  Pt. Encouraged to let me know if needs arise.
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
## 2018-01-31 ENCOUNTER — Encounter: Payer: Self-pay | Admitting: Psychiatry

## 2018-01-31 ENCOUNTER — Inpatient Hospital Stay
Admission: AD | Admit: 2018-01-31 | Discharge: 2018-02-02 | DRG: 885 | Disposition: A | Discharge status: Home / Self Care | Payer: Medicaid Other | Attending: Psychiatry | Admitting: Psychiatry

## 2018-01-31 DIAGNOSIS — F11959 Opioid use, unspecified with opioid-induced psychotic disorder, unspecified: Secondary | ICD-10-CM | POA: Diagnosis present

## 2018-01-31 DIAGNOSIS — F10950 Alcohol use, unspecified with alcohol-induced psychotic disorder with delusions: Secondary | ICD-10-CM | POA: Diagnosis not present

## 2018-01-31 DIAGNOSIS — F23 Brief psychotic disorder: Principal | ICD-10-CM | POA: Diagnosis present

## 2018-01-31 DIAGNOSIS — F19959 Other psychoactive substance use, unspecified with psychoactive substance-induced psychotic disorder, unspecified: Secondary | ICD-10-CM

## 2018-01-31 MED ORDER — LORAZEPAM 2 MG/ML IJ SOLN
2.0000 mg | Freq: Once | INTRAMUSCULAR | Status: AC
  Administered 2018-01-31: 2 mg via INTRAMUSCULAR
  Filled 2018-01-31: qty 1

## 2018-01-31 MED ORDER — ZIPRASIDONE MESYLATE 20 MG IM SOLR
20.0000 mg | Freq: Once | INTRAMUSCULAR | Status: AC
  Administered 2018-01-31: 20 mg via INTRAMUSCULAR
  Filled 2018-01-31: qty 20

## 2018-01-31 MED ORDER — ACETAMINOPHEN 325 MG PO TABS: 650.0000 mg | ORAL_TABLET | Freq: Four times a day (QID) | ORAL | Status: DC | PRN

## 2018-01-31 MED ORDER — LORAZEPAM 2 MG/ML IJ SOLN: 2.0000 mg | Freq: Once | INTRAMUSCULAR | Status: DC

## 2018-01-31 MED ORDER — ALUM & MAG HYDROXIDE-SIMETH 200-200-20 MG/5ML PO SUSP: 30.0000 mL | ORAL | Status: DC | PRN

## 2018-01-31 MED ORDER — TRAZODONE HCL 100 MG PO TABS
100.0000 mg | ORAL_TABLET | Freq: Every evening | ORAL | Status: DC | PRN
  Administered 2018-01-31 – 2018-02-01 (×2): 100 mg via ORAL
  Filled 2018-01-31 (×2): qty 1

## 2018-01-31 MED ORDER — MAGNESIUM HYDROXIDE 400 MG/5ML PO SUSP: 30.0000 mL | Freq: Every day | ORAL | Status: DC | PRN

## 2018-01-31 MED ORDER — HYDROXYZINE HCL 50 MG PO TABS
50.0000 mg | ORAL_TABLET | Freq: Three times a day (TID) | ORAL | Status: DC | PRN
  Administered 2018-01-31: 50 mg via ORAL
  Filled 2018-01-31: qty 1

## 2018-01-31 MED ORDER — LORAZEPAM 2 MG PO TABS
2.0000 mg | ORAL_TABLET | ORAL | Status: DC | PRN
  Administered 2018-01-31: 2 mg via ORAL
  Filled 2018-01-31: qty 1

## 2018-01-31 NOTE — ED Provider Notes (Addendum)
-----------------------------------------   8:17 AM on 01/31/2018 -----------------------------------------  Patient is acutely psychotic.  Holding blankets over his body and head saying he is trying to reflect the light so a "refractile" cannot find him because "[employer] is trying to kill him".  Patient is crouching in the corner of the room with sheets and blankets over him.  We will dose 20 mg of Geodon and 2 mg of IM Ativan while awaiting psychiatric evaluation.   Minna Antis, MD 01/31/18 [phone]  Psychiatry will be admitting to their service once a bed becomes available.   Minna Antis, MD 01/31/18 1512

## 2018-01-31 NOTE — ED Notes (Signed)
Report to include Situation, Background, Assessment, and Recommendations received from [facility]. Patient alert and oriented, warm and dry, in no acute distress but still has paranoid ideation. Patient denies SI, HI, AVH and pain. Patient made aware of Q15 minute rounds and Psychologist, counselling presence for their safety. Patient instructed to come to me with needs or concerns.

## 2018-01-31 NOTE — Plan of Care (Signed)
Patient just recently admitted to the unit this evening. Patient has not had sufficient time to show progressions in outlined treatment care planning.    Problem: Safety: Goal: Periods of time without injury will increase Outcome: Not Progressing   Problem: Education: Goal: Will be free of psychotic symptoms Outcome: Not Progressing Goal: Knowledge of the prescribed therapeutic regimen will improve Outcome: Not Progressing   Problem: Health Behavior/Discharge Planning: Goal: Compliance with prescribed medication regimen will improve Outcome: Not Progressing   Problem: Safety: Goal: Ability to redirect hostility and anger into socially appropriate behaviors will improve Outcome: Not Progressing Goal: Ability to remain free from injury will improve Outcome: Not Progressing   Problem: Physical Regulation: Goal: Complications related to the disease process, condition or treatment will be avoided or minimized Outcome: Not Progressing

## 2018-01-31 NOTE — ED Notes (Signed)
Hourly rounding reveals patient sleeping in room. No complaints, stable, in no acute distress. Q15 minute rounds and monitoring via Rover and Officer to continue.  

## 2018-01-31 NOTE — ED Notes (Signed)
Hourly rounding reveals patient in room. No complaints, stable, in no acute distress. Q15 minute rounds and monitoring via Rover and Officer to continue.   

## 2018-01-31 NOTE — ED Notes (Signed)
Hourly rounding reveals patient in room. Still displaying paranoid behavior but redirectable, stable, in no acute distress. Q15 minute rounds and monitoring via Psychologist, counselling to continue.

## 2018-01-31 NOTE — ED Notes (Signed)
Patient currently resting in bed. This RN assumed care. Will continue to monitor.

## 2018-01-31 NOTE — BH Assessment (Signed)
Patient is to be admitted to [facility] by Dr. Toni Amend.  Attending Physician will be Dr. Johnella Moloney.   Patient has been assigned to room 309, by [facility] Charge Nurse Browning F.   Intake Paper Work has been signed and placed on patient chart.  ER staff is aware of the admission:  [facility] ER Secretary    Dr. Lenard Lance, ER MD   Selena Batten Patient's Nurse   Ethelene Browns Patient Access.

## 2018-01-31 NOTE — Consult Note (Signed)
Psychiatry: Chart reviewed.  Case discussed with TTS.  TTS recommendation for inpatient treatment.  Orders completed.
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
## 2018-01-31 NOTE — ED Notes (Signed)
Hourly rounding reveals patient sleeping in room. Pt. Sleeping on floor between bed and wall related to paranoia. No complaints, stable, in no acute distress. Q15 minute rounds and monitoring via Psychologist, counselling to continue.

## 2018-01-31 NOTE — ED Notes (Signed)
IVC/ TTS consult completed recommend Inpt. Admit/ Dr. Toni Amend placed orders
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
## 2018-01-31 NOTE — Tx Team (Signed)
Initial Treatment Plan 01/31/2018 10:50 PM [name]:[id number]    PATIENT STRESSORS: Financial difficulties Legal issue Marital or family conflict Substance abuse Traumatic event   PATIENT STRENGTHS: Capable of independent living Communication skills Physical Health Supportive family/friends   PATIENT IDENTIFIED PROBLEMS: Psychosis 01/31/18  Substance Abuse/Misuse 01/31/18  Stress 01/31/18                 DISCHARGE CRITERIA:  Ability to meet basic life and health needs Improved stabilization in mood, thinking, and/or behavior Need for constant or close observation no longer present  PRELIMINARY DISCHARGE PLAN: Outpatient therapy Participate in family therapy Placement in alternative living arrangements  PATIENT/FAMILY INVOLVEMENT: This treatment plan has been presented to and reviewed with the patient, Michael [name].  The patient has been given the opportunity to ask questions and make suggestions.  Lenox Ponds, RN 01/31/2018, 10:50 PM

## 2018-01-31 NOTE — ED Notes (Addendum)
Patient given ziprasidone (GEODON) injection 20 mg in left deltoid and LORazepam (ATIVAN) injection 2 mg in the right deltoid due to severe paranoia and anxiety. Patient did not fight, he was appropriate and cooperative. NAD noted

## 2018-01-31 NOTE — ED Notes (Signed)
Hourly rounding reveals patient in hall bed. No complaints, stable, in no acute distress, still paranoid but redirectable. Q15 minute rounds and monitoring via Psychologist, counselling to continue.

## 2018-01-31 NOTE — ED Notes (Signed)
Spoke to [employer] in [employer]. Requesting to bring patient down to BMU after 1930.

## 2018-02-01 DIAGNOSIS — F23 Brief psychotic disorder: Principal | ICD-10-CM

## 2018-02-01 LAB — HEMOGLOBIN A1C
Hgb A1c MFr Bld: 5.5 % (ref 4.8–5.6)
Mean Plasma Glucose: 111.15 mg/dL

## 2018-02-01 LAB — LIPID PANEL
Cholesterol: 144 mg/dL (ref 0–200)
HDL: 67 mg/dL
LDL Cholesterol: 65 mg/dL (ref 0–99)
Total CHOL/HDL Ratio: 2.1 ratio
Triglycerides: 60 mg/dL
VLDL: 12 mg/dL (ref 0–40)

## 2018-02-01 LAB — TSH: TSH: 1.857 u[IU]/mL (ref 0.350–4.500)

## 2018-02-01 MED ORDER — OLANZAPINE 10 MG IM SOLR: 10.0000 mg | Freq: Three times a day (TID) | INTRAMUSCULAR | Status: DC | PRN

## 2018-02-01 MED ORDER — OLANZAPINE 5 MG PO TABS
5.0000 mg | ORAL_TABLET | Freq: Two times a day (BID) | ORAL | Status: DC
  Filled 2018-02-01: qty 1

## 2018-02-01 MED ORDER — ENSURE ENLIVE PO LIQD
237.0000 mL | Freq: Two times a day (BID) | ORAL | Status: DC
  Administered 2018-02-02: 237 mL via ORAL

## 2018-02-01 MED ORDER — OLANZAPINE 10 MG PO TABS
10.0000 mg | ORAL_TABLET | Freq: Every day | ORAL | Status: DC
  Administered 2018-02-01: 10 mg via ORAL
  Filled 2018-02-01: qty 1

## 2018-02-01 NOTE — Progress Notes (Signed)
Patient refused morning medication stating "I don't know how the doctor is ordering me something and they haven't even seen me, I'm not taking anything until I see the doctor". MD notified.

## 2018-02-01 NOTE — Progress Notes (Signed)
D- Patient alert and oriented. Patient presents in a pleasant mood on assessment reporting complaints of pain in his hands stating "my hands hurt like all get out", however, patient did not request any medication from this writer because "I don't take medicines". Patient denies SI, HI, AVH, at this time stating to this writer "I'm pretty chill right now". Patient endorses depression stating to this writer "I'm here in this room, I'm ready to go and work, I'm losing money, and I might lose my job". Patient also denies any anxiety. Patient had no stated goals for today other than "I'm ready to go, I'm not going to play basketball for five days".  A- Support and encouragement provided. Routine safety checks conducted every 15 minutes. Patient informed to notify staff with problems or concerns.  R- Patient contracts for safety at this time. Patient compliant with treatment plan. Patient receptive, calm, and cooperative. Patient interacts well with others on the unit.  Patient remains safe at this time.

## 2018-02-01 NOTE — Progress Notes (Signed)
Recreation Therapy Notes  Date: 02/01/2018  Time: 9:30 am   Location: Craft room   Behavioral response: N/A   Intervention Topic: Life planning  Discussion/Intervention: Patient did not attend group.   Clinical Observations/Feedback:  Patient did not attend group.   [name] LRT/CTRS        [name] 02/01/2018 10:45 AM
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
## 2018-02-01 NOTE — Plan of Care (Signed)
Patient has been free from injury thus far on the unit and denies SI/HI/AVH at this time. Patient verbalizes understanding of his prescribed therapeutic regimen and all questions/concerns have been addressed and answered at this time. Patient has the ability to redirect his anger/hostility into appropriate behaviors. Patient has not experienced any health related complications and remains safe on the unit at this time.  Problem: Safety: Goal: Periods of time without injury will increase Outcome: Progressing   Problem: Education: Goal: Will be free of psychotic symptoms Outcome: Progressing Goal: Knowledge of the prescribed therapeutic regimen will improve Outcome: Progressing   Problem: Health Behavior/Discharge Planning: Goal: Compliance with prescribed medication regimen will improve Outcome: Progressing   Problem: Safety: Goal: Ability to redirect hostility and anger into socially appropriate behaviors will improve Outcome: Progressing Goal: Ability to remain free from injury will improve Outcome: Progressing   Problem: Physical Regulation: Goal: Complications related to the disease process, condition or treatment will be avoided or minimized Outcome: Progressing   Problem: Safety: Goal: Ability to remain free from injury will improve Outcome: Progressing
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
## 2018-02-01 NOTE — Progress Notes (Signed)
Patient states to this writer "I need to get out of here, I'm going to lose my job, my apartment, and my house, this is ridiculous".

## 2018-02-01 NOTE — [facility] Suicide Risk Assessment (Signed)
[facility] INPATIENT:  Family/Significant Other Suicide Prevention Education  Suicide Prevention Education:  Patient Refusal for Family/Significant Other Suicide Prevention Education: The patient Michael [name] has refused to provide written consent for family/significant other to be provided Family/Significant Other Suicide Prevention Education during admission and/or prior to discharge.  Physician notified.  Johny Shears 02/01/2018, 12:57 PM
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
## 2018-02-02 DIAGNOSIS — F19959 Other psychoactive substance use, unspecified with psychoactive substance-induced psychotic disorder, unspecified: Secondary | ICD-10-CM

## 2018-02-02 MED ORDER — OLANZAPINE 10 MG PO TABS: 10.0000 mg | ORAL_TABLET | Freq: Every day | ORAL | 0 refills | Status: DC

## 2018-02-02 NOTE — Progress Notes (Signed)
Recreation Therapy Notes  Date: 02/02/2018  Time: 9:30 am   Location: Craft room   Behavioral response: N/A   Intervention Topic: Goals  Discussion/Intervention: Patient did not attend group.   Clinical Observations/Feedback:  Patient did not attend group.   Kima Malenfant LRT/CTRS        Raigan Baria 02/02/2018 11:27 AM 

## 2018-02-02 NOTE — BHH Suicide Risk Assessment (Signed)
[facility] Discharge Suicide Risk Assessment   Principal Problem: Brief reactive psychosis [facility]) Discharge Diagnoses:  Patient Active Problem List   Diagnosis Date Noted  . 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 Cellulitis and abscess of upper extremity [L03.119, L02.419] 07/27/2017    Mental Status Per Nursing Assessment::   On Admission:  NA(Denies)  Demographic Factors:  Male and Caucasian  Loss Factors: Financial problems/change in socioeconomic status  Historical Factors: Impulsivity  Risk Reduction Factors:   Responsible for children under 42 years of age and Sense of responsibility to family  Continued Clinical Symptoms:  Alcohol/Substance Abuse/Dependencies  Cognitive Features That Contribute To Risk:  None    Suicide Risk:  Minimal: No identifiable suicidal ideation.    Follow-up Information    [facility] Follow up.   Contact information: [address] [place] [address] [phone]            Haskell Riling, MD 02/02/2018, 10:04 AM

## 2018-02-02 NOTE — Progress Notes (Signed)
  [facility] Adult Case Management Discharge Plan :  Will you be returning to the same living situation after discharge:  Yes,  Home At discharge, do you have transportation home?: Yes,  Patient will call for a ride at discharge Do you have the ability to pay for your medications: Yes,  Referred to a provider who can assist  Release of information consent forms completed and in the chart;  Patient's signature needed at discharge.  Patient to Follow up at: Follow-up Information    [facility]. Go on 02/04/2018.   Why:  Please follow up on Friday February 04, 2018 at 8:30am for your hospital follow up appointment. Please bring your ID, discharge paperwork and all medications that you are taking. Thank you. Contact information: [address] [place] [address] [phone]           Next level of care provider has access to [facility] Link:no  Safety Planning and Suicide Prevention discussed: Yes,  Completed with Patient     Has patient been referred to the Quitline?: Patient refused referral  Patient has been referred for addiction treatment: Yes  Johny Shears, LCSW 02/02/2018, 10:09 AM

## 2018-02-02 NOTE — Plan of Care (Addendum)
Patient found awake in bed upon my arrival. Patient is intermittently visible and somewhat social this evening. Upon approach, patient is pleasant and polite. Mood appears euphoric, affect animated, speech is loud, pressured, rapid, and patient laughs often. Patient denies all complaints including SI/HI/AVH. Patient voices no delusions, paranoia, and is organized. Thought processes appear intact at this time. Verbalized understanding of education. Appetite is good. Denies pain. Compliant with HS medication and staff direction. Given Trazodone for sleep with positive results. Q 15 minute checks maintained. Will continue to monitor throughout the shift. Patient slept 7.5 hours. No apparent distress. Will endorse care to oncoming shift.  Problem: Education: Goal: Knowledge of the prescribed therapeutic regimen will improve Outcome: Progressing   Problem: Health Behavior/Discharge Planning: Goal: Compliance with prescribed medication regimen will improve Outcome: Progressing   Problem: Safety: Goal: Ability to remain free from injury will improve Outcome: Progressing

## 2018-02-02 NOTE — Tx Team (Signed)
Interdisciplinary Treatment and Diagnostic Plan Update  02/02/2018 Time of Session: 11am HALIL RENTZ MRN: [id number]  Principal Diagnosis: Brief reactive psychosis [facility])  Secondary Diagnoses: Principal Problem:   Brief reactive psychosis (HCC)   Current Medications:  Current Facility-Administered Medications  Medication Dose Route Frequency Provider Last Rate Last Dose  . acetaminophen (TYLENOL) tablet 650 mg  650 mg Oral Q6H PRN Clapacs, John T, MD      . alum & mag hydroxide-simeth (MAALOX/MYLANTA) 200-200-20 MG/5ML suspension 30 mL  30 mL Oral Q4H PRN Clapacs, John T, MD      . feeding supplement (ENSURE ENLIVE) (ENSURE ENLIVE) liquid 237 mL  237 mL Oral BID BM McNew, Holly R, MD      . hydrOXYzine (ATARAX/VISTARIL) tablet 50 mg  50 mg Oral TID PRN Clapacs, Jackquline Denmark, MD   50 mg at 01/31/18 2211  . LORazepam (ATIVAN) tablet 2 mg  2 mg Oral Q4H PRN Clapacs, Jackquline Denmark, MD   2 mg at 01/31/18 2351  . magnesium hydroxide (MILK OF MAGNESIA) suspension 30 mL  30 mL Oral Daily PRN Clapacs, John T, MD      . OLANZapine (ZYPREXA) injection 10 mg  10 mg Intramuscular Q8H PRN McNew, Holly R, MD      . OLANZapine (ZYPREXA) tablet 10 mg  10 mg Oral QHS McNew, Ileene Hutchinson, MD   10 mg at 02/01/18 2108  . traZODone (DESYREL) tablet 100 mg  100 mg Oral QHS PRN Clapacs, Jackquline Denmark, MD   100 mg at 02/01/18 2108   PTA Medications: No medications prior to admission.    Patient Stressors: Paediatric nurse issue Marital or family conflict Substance abuse Traumatic event  Patient Strengths: Capable of independent living Communication skills Physical Health Supportive family/friends  Treatment Modalities: Medication Management, Group therapy, Case management,  1 to 1 session with clinician, Psychoeducation, Recreational therapy.   Physician Treatment Plan for Primary Diagnosis: Brief reactive psychosis (HCC) Long Term Goal(s): Improvement in symptoms so as ready for discharge   Short  Term Goals: Ability to demonstrate self-control will improve  Medication Management: Evaluate patient's response, side effects, and tolerance of medication regimen.  Therapeutic Interventions: 1 to 1 sessions, Unit Group sessions and Medication administration.  Evaluation of Outcomes: Progressing  Physician Treatment Plan for Secondary Diagnosis: Principal Problem:   Brief reactive psychosis (HCC)  Long Term Goal(s): Improvement in symptoms so as ready for discharge   Short Term Goals: Ability to demonstrate self-control will improve     Medication Management: Evaluate patient's response, side effects, and tolerance of medication regimen.  Therapeutic Interventions: 1 to 1 sessions, Unit Group sessions and Medication administration.  Evaluation of Outcomes: Progressing   RN Treatment Plan for Primary Diagnosis: Brief reactive psychosis (HCC) Long Term Goal(s): Knowledge of disease and therapeutic regimen to maintain health will improve  Short Term Goals: Ability to demonstrate self-control, Ability to verbalize feelings will improve, Ability to identify and develop effective coping behaviors will improve and Compliance with prescribed medications will improve  Medication Management: RN will administer medications as ordered by provider, will assess and evaluate patient's response and provide education to patient for prescribed medication. RN will report any adverse and/or side effects to prescribing provider.  Therapeutic Interventions: 1 on 1 counseling sessions, Psychoeducation, Medication administration, Evaluate responses to treatment, Monitor vital signs and CBGs as ordered, Perform/monitor CIWA, COWS, AIMS and Fall Risk screenings as ordered, Perform wound care treatments as ordered.  Evaluation of Outcomes: Progressing   LCSW  Treatment Plan for Primary Diagnosis: Brief reactive psychosis (HCC) Long Term Goal(s): Safe transition to appropriate next level of care at discharge,  Engage patient in therapeutic group addressing interpersonal concerns.  Short Term Goals: Engage patient in aftercare planning with referrals and resources, Increase social support, Increase emotional regulation, Identify triggers associated with mental health/substance abuse issues and Increase skills for wellness and recovery  Therapeutic Interventions: Assess for all discharge needs, 1 to 1 time with Social worker, Explore available resources and support systems, Assess for adequacy in community support network, Educate family and significant other(s) on suicide prevention, Complete Psychosocial Assessment, Interpersonal group therapy.  Evaluation of Outcomes: Progressing   Progress in Treatment: Attending groups: No. Participating in groups: No. Taking medication as prescribed: Yes. Toleration medication: Yes. Family/Significant other contact made: No, will contact:  Patient refused Patient understands diagnosis: Yes. Discussing patient identified problems/goals with staff: Yes. Medical problems stabilized or resolved: Yes. Denies suicidal/homicidal ideation: Yes. Issues/concerns per patient self-inventory: No. Other:  New problem(s) identified: No, Describe:  None  New Short Term/Long Term Goal(s): "To follow up with [facility] and get back to my son."  Patient Goals:  "To follow up with [facility] and get back to my son."  Discharge Plan or Barriers: To be discharged and follow up with [facility].   Reason for Continuation of Hospitalization: Medication stabilization  Estimated Length of Stay: Discharge Today  Attendees: Patient:Michael [name] 02/02/2018 11:10 AM  Physician: Corinna Gab, MD 02/02/2018 11:10 AM  Nursing: Milas Hock, RN 02/02/2018 11:10 AM  RN Care Manager: 02/02/2018 11:10 AM  Social Worker: Johny Shears, LCSWA 02/02/2018 11:10 AM  Recreational Therapist: Danella Deis. Outlaw CTRS, LRT 02/02/2018 11:10 AM  Other: Lowella Dandy, LCSW 02/02/2018 11:10 AM   Other:  02/02/2018 11:10 AM  Other: 02/02/2018 11:10 AM    Scribe for Treatment Team: Johny Shears, LCSW 02/02/2018 11:10 AM

## 2018-02-02 NOTE — Progress Notes (Signed)
Patient alert and oriented x 4. Ambulates unit with steady gait. Verbally denies SI/HI/AVH and pain. Patient discharged on above date and time. Verbalized understanding the discharge information provided to patient upon discharge. Patient departed unit with discharge paperwork, prescriptions and personal belongings. Picked up by his father to go home and follow up with [facility] outpatient.
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
## 2018-03-12 ENCOUNTER — Other Ambulatory Visit: Payer: Self-pay

## 2018-03-12 ENCOUNTER — Ambulatory Visit
Admission: EM | Admit: 2018-03-12 | Discharge: 2018-03-12 | Disposition: A | Discharge status: Home / Self Care | Payer: Medicaid Other | Attending: Family Medicine | Admitting: Family Medicine

## 2018-03-12 ENCOUNTER — Encounter: Payer: Self-pay | Admitting: Gynecology

## 2018-03-12 DIAGNOSIS — J069 Acute upper respiratory infection, unspecified: Secondary | ICD-10-CM | POA: Diagnosis not present

## 2018-03-12 DIAGNOSIS — L509 Urticaria, unspecified: Secondary | ICD-10-CM | POA: Insufficient documentation

## 2018-03-12 DIAGNOSIS — L508 Other urticaria: Secondary | ICD-10-CM | POA: Diagnosis not present

## 2018-03-12 DIAGNOSIS — R509 Fever, unspecified: Secondary | ICD-10-CM | POA: Diagnosis present

## 2018-03-12 LAB — RAPID STREP SCREEN (MED CTR MEBANE ONLY): Streptococcus, Group A Screen (Direct): NEGATIVE

## 2018-03-12 MED ORDER — PREDNISONE 20 MG PO TABS: 20.0000 mg | ORAL_TABLET | Freq: Every day | ORAL | 0 refills | Status: DC

## 2018-03-12 NOTE — Discharge Instructions (Signed)
Over the counter benadryl and claritin or zyrtec daily

## 2018-03-12 NOTE — ED Triage Notes (Signed)
Patient c/o body ache/ fever x 1 week ago. Per patient now with rash all over his body/ loss of appetite.

## 2018-03-12 NOTE — ED Provider Notes (Signed)
[facility]    CSN: [id number] Arrival date & time: 03/12/18  1191     History   Chief Complaint No chief complaint on file.   HPI Michael [name] is a 47 y.o. male.   47 yo male with a c/o itchy rash all over his body for the past 2-3 days. States rash waxes and wanes. Also states he had a viral URI this past week with high fevers, chills, congestion but now this is resolving.   The history is provided by the patient.    Past Medical History:  Diagnosis Date  . Patient denies medical problems     Patient Active Problem List   Diagnosis Date Noted  . 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 Cellulitis and abscess of upper extremity 07/27/2017    Past Surgical History:  Procedure Laterality Date  . NO PAST SURGERIES         Home Medications    Prior to Admission medications   Medication Sig Start Date End Date Taking? Authorizing Provider  OLANZapine (ZYPREXA) 10 MG tablet Take 1 tablet (10 mg total) by mouth at bedtime. 02/02/18  Yes McNew, Ileene Hutchinson, MD  predniSONE (DELTASONE) 20 MG tablet Take 1 tablet (20 mg total) by mouth daily. 03/12/18   Payton Mccallum, MD    Family History Family History  Problem Relation Age of Onset  . Heart failure Mother     Social History Social History   Tobacco Use  . Smoking status: Never Smoker  . Smokeless tobacco: Never Used  Substance Use Topics  . Alcohol use: Yes  . Drug use: Yes    Types: Marijuana, Methamphetamines     Allergies   Patient has no known allergies.   Review of Systems Review of Systems   Physical Exam Triage Vital Signs ED Triage Vitals  Enc Vitals Group     BP 03/12/18 0947 (!) 124/91     Pulse Rate 03/12/18 0947 81     Resp --      Temp 03/12/18 0947 98.1 F (36.7 C)     Temp Source 03/12/18 0947 Oral     SpO2 03/12/18 0947 100 %     Weight 03/12/18 0949 160 lb (72.6 kg)     Height 03/12/18  0949 6\' 3"  (1.905 m)     Head Circumference --      Peak Flow --      Pain Score 03/12/18 1046 0     Pain Loc --      Pain Edu? --      Excl. in GC? --    No data found.  Updated Vital Signs BP (!) 124/91 (BP Location: Left Arm)   Pulse 81   Temp 98.1 F (36.7 C) (Oral)   Ht 6\' 3"  (1.905 m)   Wt 72.6 kg   SpO2 100%   BMI 20.00 kg/m   Visual Acuity Right Eye Distance:   Left Eye Distance:   Bilateral Distance:    Right Eye Near:   Left Eye Near:    Bilateral Near:     Physical Exam  Constitutional: He appears well-developed and well-nourished. No distress.  Neck: No thyromegaly present.  Cardiovascular: Normal rate, regular rhythm and normal heart sounds.  Pulmonary/Chest: Effort normal and breath sounds normal. No stridor. No respiratory distress. He has no wheezes. He has no rales.  Skin: Rash noted. Rash is urticarial (diffuse on back, legs, arms). He is  not diaphoretic.  Nursing note and vitals reviewed.    UC Treatments / Results  Labs (all labs ordered are listed, but only abnormal results are displayed) Labs Reviewed  RAPID STREP SCREEN ([facility] ONLY)  CULTURE, GROUP A STREP [facility])    EKG None  Radiology No results found.  Procedures Procedures (including critical care time)  Medications Ordered in UC Medications - No data to display  Initial Impression / Assessment and Plan / UC Course  I have reviewed the triage vital signs and the nursing notes.  Pertinent labs & imaging results that were available during my care of the patient were reviewed by me and considered in my medical decision making (see chart for details).      Final Clinical Impressions(s) / UC Diagnoses   Final diagnoses:  Urticaria, acute  Viral URI     Discharge Instructions     Over the counter benadryl and claritin or zyrtec daily    ED Prescriptions    Medication Sig Dispense Auth. Provider   predniSONE (DELTASONE) 20 MG tablet Take 1 tablet (20 mg  total) by mouth daily. 7 tablet Payton Mccallumonty, Maycen Degregory, MD      1. diagnosis reviewed with patient 2. rx as per orders above; reviewed possible side effects, interactions, risks and benefits  3. Recommend supportive treatment as above 4. Follow-up prn if symptoms worsen or don't improve   Controlled Substance Prescriptions [place] Controlled Substance Registry consulted? Not Applicable   Payton Mccallumonty, Dawnetta Copenhaver, MD 03/12/18 1106

## 2018-03-15 LAB — CULTURE, GROUP A STREP (THRC)

## 2018-04-14 ENCOUNTER — Other Ambulatory Visit: Payer: Self-pay

## 2018-04-14 ENCOUNTER — Encounter: Payer: Self-pay | Admitting: Emergency Medicine

## 2018-04-14 ENCOUNTER — Inpatient Hospital Stay
Admission: EM | Admit: 2018-04-14 | Discharge: 2018-04-19 | DRG: 581 | Disposition: A | Discharge status: Home / Self Care | Payer: Medicaid Other | Attending: Internal Medicine | Admitting: Internal Medicine

## 2018-04-14 ENCOUNTER — Ambulatory Visit (INDEPENDENT_AMBULATORY_CARE_PROVIDER_SITE_OTHER)
Admission: EM | Admit: 2018-04-14 | Discharge: 2018-04-14 | Disposition: A | Discharge status: Other Acute Inpatient Hospital | Payer: Medicaid Other | Source: Home / Self Care | Attending: Family Medicine | Admitting: Family Medicine

## 2018-04-14 DIAGNOSIS — L0391 Acute lymphangitis, unspecified: Secondary | ICD-10-CM | POA: Diagnosis not present

## 2018-04-14 DIAGNOSIS — Z79899 Other long term (current) drug therapy: Secondary | ICD-10-CM | POA: Diagnosis not present

## 2018-04-14 DIAGNOSIS — L02413 Cutaneous abscess of right upper limb: Secondary | ICD-10-CM | POA: Diagnosis present

## 2018-04-14 DIAGNOSIS — W57XXXA Bitten or stung by nonvenomous insect and other nonvenomous arthropods, initial encounter: Secondary | ICD-10-CM | POA: Diagnosis present

## 2018-04-14 DIAGNOSIS — L03113 Cellulitis of right upper limb: Principal | ICD-10-CM

## 2018-04-14 DIAGNOSIS — R21 Rash and other nonspecific skin eruption: Secondary | ICD-10-CM | POA: Diagnosis not present

## 2018-04-14 DIAGNOSIS — B9689 Other specified bacterial agents as the cause of diseases classified elsewhere: Secondary | ICD-10-CM | POA: Diagnosis not present

## 2018-04-14 DIAGNOSIS — L02419 Cutaneous abscess of limb, unspecified: Secondary | ICD-10-CM | POA: Diagnosis not present

## 2018-04-14 DIAGNOSIS — L03119 Cellulitis of unspecified part of limb: Secondary | ICD-10-CM | POA: Diagnosis not present

## 2018-04-14 DIAGNOSIS — L0390 Cellulitis, unspecified: Secondary | ICD-10-CM

## 2018-04-14 LAB — CBC WITH DIFFERENTIAL/PLATELET
Abs Immature Granulocytes: 0.07 10*3/uL (ref 0.00–0.07)
BASOS ABS: 0 10*3/uL (ref 0.0–0.1)
BASOS PCT: 0 %
EOS PCT: 1 %
Eosinophils Absolute: 0.2 10*3/uL (ref 0.0–0.5)
HCT: 42.6 % (ref 39.0–52.0)
Hemoglobin: 14.1 g/dL (ref 13.0–17.0)
Immature Granulocytes: 0 %
Lymphocytes Relative: 9 %
Lymphs Abs: 1.4 10*3/uL (ref 0.7–4.0)
MCH: 31.4 pg (ref 26.0–34.0)
MCHC: 33.1 g/dL (ref 30.0–36.0)
MCV: 94.9 fL (ref 80.0–100.0)
Monocytes Absolute: 1.6 10*3/uL — ABNORMAL HIGH (ref 0.1–1.0)
Monocytes Relative: 10 %
NRBC: 0 % (ref 0.0–0.2)
Neutro Abs: 12.8 10*3/uL — ABNORMAL HIGH (ref 1.7–7.7)
Neutrophils Relative %: 80 %
PLATELETS: 237 10*3/uL (ref 150–400)
RBC: 4.49 MIL/uL (ref 4.22–5.81)
RDW: 13.4 % (ref 11.5–15.5)
WBC: 16 10*3/uL — AB (ref 4.0–10.5)

## 2018-04-14 LAB — COMPREHENSIVE METABOLIC PANEL
ALT: 12 U/L (ref 0–44)
ANION GAP: 8 (ref 5–15)
AST: 16 U/L (ref 15–41)
Albumin: 4.2 g/dL (ref 3.5–5.0)
Alkaline Phosphatase: 82 U/L (ref 38–126)
BUN: 12 mg/dL (ref 6–20)
CHLORIDE: 100 mmol/L (ref 98–111)
CO2: 28 mmol/L (ref 22–32)
Calcium: 9.4 mg/dL (ref 8.9–10.3)
Creatinine, Ser: 1.04 mg/dL (ref 0.61–1.24)
GFR calc non Af Amer: >60 mL/min (ref >60)
Glucose, Bld: 113 mg/dL — ABNORMAL HIGH (ref 70–99)
Potassium: 3.7 mmol/L (ref 3.5–5.1)
SODIUM: 136 mmol/L (ref 135–145)
Total Bilirubin: 1.4 mg/dL — ABNORMAL HIGH (ref 0.3–1.2)
Total Protein: 7.5 g/dL (ref 6.5–8.1)

## 2018-04-14 LAB — CG4 I-STAT (LACTIC ACID): Lactic Acid, Venous: 1.69 mmol/L (ref 0.5–1.9)

## 2018-04-14 MED ORDER — OLANZAPINE 5 MG PO TABS
10.0000 mg | ORAL_TABLET | Freq: Every day | ORAL | Status: DC
  Administered 2018-04-14 – 2018-04-18 (×5): 10 mg via ORAL
  Filled 2018-04-14 (×5): qty 2

## 2018-04-14 MED ORDER — ONDANSETRON HCL 4 MG/2ML IJ SOLN
4.0000 mg | Freq: Once | INTRAMUSCULAR | Status: AC
  Administered 2018-04-14: 4 mg via INTRAVENOUS
  Filled 2018-04-14: qty 2

## 2018-04-14 MED ORDER — ACETAMINOPHEN 325 MG PO TABS: 650.0000 mg | ORAL_TABLET | Freq: Four times a day (QID) | ORAL | Status: DC | PRN

## 2018-04-14 MED ORDER — MORPHINE SULFATE (PF) 4 MG/ML IV SOLN
4.0000 mg | Freq: Once | INTRAVENOUS | Status: AC
  Administered 2018-04-14: 4 mg via INTRAVENOUS
  Filled 2018-04-14: qty 1

## 2018-04-14 MED ORDER — SODIUM CHLORIDE 0.9 % IV SOLN
INTRAVENOUS | Status: DC
  Administered 2018-04-15: via INTRAVENOUS

## 2018-04-14 MED ORDER — DIPHENHYDRAMINE HCL 25 MG PO CAPS
25.0000 mg | ORAL_CAPSULE | Freq: Once | ORAL | Status: AC
  Administered 2018-04-14: 25 mg via ORAL
  Filled 2018-04-14: qty 1

## 2018-04-14 MED ORDER — VANCOMYCIN HCL 10 G IV SOLR
1250.0000 mg | Freq: Three times a day (TID) | INTRAVENOUS | Status: DC
  Administered 2018-04-15 – 2018-04-19 (×14): 1250 mg via INTRAVENOUS
  Filled 2018-04-14 (×18): qty 1250

## 2018-04-14 MED ORDER — VANCOMYCIN HCL IN DEXTROSE 1-5 GM/200ML-% IV SOLN
1000.0000 mg | Freq: Once | INTRAVENOUS | Status: AC
  Administered 2018-04-14: 1000 mg via INTRAVENOUS
  Filled 2018-04-14: qty 200

## 2018-04-14 MED ORDER — SODIUM CHLORIDE 0.9 % IV SOLN
2.0000 g | INTRAVENOUS | Status: DC
  Administered 2018-04-14 – 2018-04-17 (×4): 2 g via INTRAVENOUS
  Filled 2018-04-14 (×3): qty 2
  Filled 2018-04-14 (×2): qty 20

## 2018-04-14 MED ORDER — OXYCODONE HCL 5 MG PO TABS
5.0000 mg | ORAL_TABLET | ORAL | Status: DC | PRN
  Administered 2018-04-14 – 2018-04-15 (×2): 5 mg via ORAL
  Filled 2018-04-14 (×2): qty 1

## 2018-04-14 MED ORDER — ONDANSETRON HCL 4 MG/2ML IJ SOLN: 4.0000 mg | Freq: Four times a day (QID) | INTRAMUSCULAR | Status: DC | PRN

## 2018-04-14 MED ORDER — ENOXAPARIN SODIUM 40 MG/0.4ML ~~LOC~~ SOLN
40.0000 mg | SUBCUTANEOUS | Status: DC
  Administered 2018-04-15 – 2018-04-18 (×3): 40 mg via SUBCUTANEOUS
  Filled 2018-04-14 (×3): qty 0.4

## 2018-04-14 MED ORDER — MORPHINE SULFATE (PF) 4 MG/ML IV SOLN
4.0000 mg | INTRAVENOUS | Status: DC | PRN
  Administered 2018-04-15 – 2018-04-17 (×9): 4 mg via INTRAVENOUS
  Filled 2018-04-14 (×9): qty 1

## 2018-04-14 MED ORDER — ONDANSETRON HCL 4 MG PO TABS: 4.0000 mg | ORAL_TABLET | Freq: Four times a day (QID) | ORAL | Status: DC | PRN

## 2018-04-14 MED ORDER — ACETAMINOPHEN 650 MG RE SUPP: 650.0000 mg | Freq: Four times a day (QID) | RECTAL | Status: DC | PRN

## 2018-04-14 NOTE — ED Triage Notes (Signed)
Patient states he has developed an infection in his right hand.  Hand is swollen hot and red

## 2018-04-14 NOTE — ED Provider Notes (Signed)
[facility]    CSN: [id number] Arrival date & time: 04/14/18  1308     History   Chief Complaint Chief Complaint  Patient presents with  . Wound Infection    HPI Michael [name] is a 47 y.o. male.   HPI  -year-old male accompanied by his girlfriend presents with a right dominant upper extremity infection.  States that on Friday he was weed eating in the gap between his glove and shirt a small bite type mark over the radial aspect of his distal forearm.  This happened 6 days ago.  He states over time he has noticed the swelling redness pain and has been pushing out purulent material.  Today he noticed the spreading of the redness swelling of his arm and a lymph node in his right axilla.  He also has not been feeling well and is found to have a temperature of 99 3.         Past Medical History:  Diagnosis Date  . Patient denies medical problems     Patient Active Problem List   Diagnosis Date Noted  . 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 Cellulitis and abscess of upper extremity 07/27/2017    Past Surgical History:  Procedure Laterality Date  . NO PAST SURGERIES         Home Medications    Prior to Admission medications   Medication Sig Start Date End Date Taking? Authorizing Provider  OLANZapine (ZYPREXA) 10 MG tablet Take 1 tablet (10 mg total) by mouth at bedtime. 02/02/18   McNew, Ileene HutchinsonHolly R, MD  predniSONE (DELTASONE) 20 MG tablet Take 1 tablet (20 mg total) by mouth daily. 03/12/18   Payton Mccallumonty, [place], MD    Family History Family History  Problem Relation Age of Onset  . Heart failure Mother     Social History Social History   Tobacco Use  . Smoking status: Never Smoker  . Smokeless tobacco: Never Used  Substance Use Topics  . Alcohol use: Yes  . Drug use: Yes    Types: Marijuana, Methamphetamines     Allergies   Patient has no known allergies.   Review  of Systems Review of Systems  Constitutional: Positive for activity change, chills, fatigue and fever.  Skin: Positive for color change and wound.  All other systems reviewed and are negative.    Physical Exam Triage Vital Signs ED Triage Vitals  Enc Vitals Group     BP 04/14/18 1346 118/90     Pulse Rate 04/14/18 1346 96     Resp --      Temp 04/14/18 1346 99.3 F (37.4 C)     Temp Source 04/14/18 1346 Oral     SpO2 04/14/18 1346 99 %     Weight 04/14/18 1344 195 lb (88.5 kg)     Height 04/14/18 1344 6\' 3"  (1.905 m)     Head Circumference --      Peak Flow --      Pain Score 04/14/18 1343 7     Pain Loc --      Pain Edu? --      Excl. in GC? --    No data found.  Updated Vital Signs BP 118/90 (BP Location: Left Arm)   Pulse 96   Temp 99.3 F (37.4 C) (Oral)   Ht 6\' 3"  (1.905 m)   Wt 195 lb (88.5 kg)   SpO2 99%   BMI 24.37 kg/m  Visual Acuity Right Eye Distance:   Left Eye Distance:   Bilateral Distance:    Right Eye Near:   Left Eye Near:    Bilateral Near:     Physical Exam Vitals signs and nursing note reviewed.  Constitutional:      General: He is not in acute distress.    Appearance: Normal appearance. He is normal weight. He is ill-appearing. He is not toxic-appearing or diaphoretic.  HENT:     Head: Normocephalic.  Eyes:     General:        Right eye: Discharge present.        Left eye: No discharge.     Conjunctiva/sclera: Conjunctivae normal.  Musculoskeletal:        General: Swelling, tenderness and signs of injury present.     Comments: Examination of the right upper extremity dominant hand extending from the fingers up into the upper arm.  He has an ascending lymphangitis present.  Several small bite type marks on his distal forearm.  There is a palpable lymph node in the right axilla.  Skin:    General: Skin is warm and dry.     Findings: Erythema present.  Neurological:     General: No focal deficit present.     Mental Status: He is  alert and oriented to person, place, and time.  Psychiatric:        Mood and Affect: Mood normal.        Behavior: Behavior normal.        Thought Content: Thought content normal.        Judgment: Judgment normal.      UC Treatments / Results  Labs (all labs ordered are listed, but only abnormal results are displayed) Labs Reviewed - No data to display  EKG None  Radiology No results found.  Procedures Procedures (including critical care time)  Medications Ordered in UC Medications - No data to display  Initial Impression / Assessment and Plan / UC Course  I have reviewed the triage vital signs and the nursing notes.  Pertinent labs & imaging results that were available during my care of the patient were reviewed by me and considered in my medical decision making (see chart for details).   Examination of the patient I have told him and his girlfriend that the has an ascending lymphangitis and likely early sepsis.  Recommend that he be seen in the emergency room for further evaluation and treatment.  They left our facility in stable condition.  He will be transported by his girlfriend in privately owned vehicle.  Final Clinical Impressions(s) / UC Diagnoses   Final diagnoses:  Ascending bacterial lymphangitis   Discharge Instructions   None    ED Prescriptions    None     Controlled Substance Prescriptions [place] Controlled Substance Registry consulted? Not Applicable   Lutricia FeilRoemer, Tambra Muller P, PA-C 04/14/18 1450

## 2018-04-14 NOTE — ED Notes (Signed)
This RN attempted to call report. Secretary informed this RN that nurse was in a pt's room at the moment, and would call back to take report.

## 2018-04-14 NOTE — H&P (Signed)
[facility] - [place] at [facility]   PATIENT NAME: Michael BlalockRobert [name]    MR#:  [id number]  DATE OF BIRTH:  10/05/1970  DATE OF ADMISSION:  04/14/2018  PRIMARY CARE PHYSICIAN: Patient, No Pcp Per   REQUESTING/REFERRING PHYSICIAN: Siadecki, MD  CHIEF COMPLAINT:   Chief Complaint  Patient presents with  . Wound Infection    HISTORY OF PRESENT ILLNESS:  Michael BlalockRobert [name]  is a 47 y.o. male who presents with chief complaint as above.  Patient presents with cellulitis of his right distal upper extremity and hand.  He states this started a few days ago, but is grown worse since.  He has been able to express pus from the region.  He is started to have symptoms radiating up his arm, and has a tender axillary lymph node.  He has an elevated white blood cell count.  Hospitalist were called for admission  PAST MEDICAL HISTORY:   Past Medical History:  Diagnosis Date  . Patient denies medical problems      PAST SURGICAL HISTORY:   Past Surgical History:  Procedure Laterality Date  . NO PAST SURGERIES       SOCIAL HISTORY:   Social History   Tobacco Use  . Smoking status: Never Smoker  . Smokeless tobacco: Never Used  Substance Use Topics  . Alcohol use: Yes     FAMILY HISTORY:   Family History  Problem Relation Age of Onset  . Heart failure Mother      DRUG ALLERGIES:  No Known Allergies  MEDICATIONS AT HOME:   Prior to Admission medications   Medication Sig Start Date End Date Taking? Authorizing Provider  OLANZapine (ZYPREXA) 10 MG tablet Take 1 tablet (10 mg total) by mouth at bedtime. 02/02/18   McNew, Ileene HutchinsonHolly R, MD  predniSONE (DELTASONE) 20 MG tablet Take 1 tablet (20 mg total) by mouth daily. 03/12/18   Payton Mccallumonty, [place], MD    REVIEW OF SYSTEMS:  Review of Systems  Constitutional: Negative for chills, fever, malaise/fatigue and weight loss.  HENT: Negative for ear pain, hearing loss and tinnitus.   Eyes: Negative for blurred  vision, double vision, pain and redness.  Respiratory: Negative for cough, hemoptysis and shortness of breath.   Cardiovascular: Negative for chest pain, palpitations, orthopnea and leg swelling.  Gastrointestinal: Negative for abdominal pain, constipation, diarrhea, nausea and vomiting.  Genitourinary: Negative for dysuria, frequency and hematuria.  Musculoskeletal: Negative for back pain, joint pain and neck pain.  Skin:       Right upper extremity erythema, swelling, tenderness  Neurological: Negative for dizziness, tremors, focal weakness and weakness.  Endo/Heme/Allergies: Negative for polydipsia. Does not bruise/bleed easily.  Psychiatric/Behavioral: Negative for depression. The patient is not nervous/anxious and does not have insomnia.      VITAL SIGNS:   Vitals:   04/14/18 1800 04/14/18 1830 04/14/18 1900 04/14/18 2000  BP: 127/79 112/73 121/79 111/67  Pulse: 84 75 78 80  Resp: 18 18 18 19   Temp:      TempSrc:      SpO2: 98% 98% 98% 99%  Weight:      Height:       Wt Readings from Last 3 Encounters:  04/14/18 88.5 kg  04/14/18 88.5 kg  03/12/18 72.6 kg    PHYSICAL EXAMINATION:  Physical Exam  Vitals reviewed. Constitutional: He is oriented to person, place, and time. He appears well-developed and well-nourished. No distress.  HENT:  Head: Normocephalic and atraumatic.  Mouth/Throat: Oropharynx is clear  and moist.  Eyes: Pupils are equal, round, and reactive to light. Conjunctivae and EOM are normal. No scleral icterus.  Neck: Normal range of motion. Neck supple. No JVD present. No thyromegaly present.  Cardiovascular: Normal rate, regular rhythm and intact distal pulses. Exam reveals no gallop and no friction rub.  No murmur heard. Respiratory: Effort normal and breath sounds normal. No respiratory distress. He has no wheezes. He has no rales.  GI: Soft. Bowel sounds are normal. He exhibits no distension. There is no abdominal tenderness.  Musculoskeletal: Normal  range of motion.        General: No edema.     Comments: No arthritis, no gout  Lymphadenopathy:    He has no cervical adenopathy.  Neurological: He is alert and oriented to person, place, and time. No cranial nerve deficit.  No dysarthria, no aphasia  Skin: Skin is warm and dry. No rash noted. There is erythema (With swelling and tenderness of his distal right upper extremity and hand, with some very small areas of fluctuance with minimal expressible pus).  Psychiatric: He has a normal mood and affect. His behavior is normal. Judgment and thought content normal.    LABORATORY PANEL:   CBC Recent Labs  Lab 04/14/18 1551  WBC 16.0*  HGB 14.1  HCT 42.6  PLT 237   ------------------------------------------------------------------------------------------------------------------  Chemistries  Recent Labs  Lab 04/14/18 1551  NA 136  K 3.7  CL 100  CO2 28  GLUCOSE 113*  BUN 12  CREATININE 1.04  CALCIUM 9.4  AST 16  ALT 12  ALKPHOS 82  BILITOT 1.4*   ------------------------------------------------------------------------------------------------------------------  Cardiac Enzymes No results for input(s): TROPONINI in the last 168 hours. ------------------------------------------------------------------------------------------------------------------  RADIOLOGY:  No results found.  EKG:   Orders placed or performed during the hospital encounter of 01/30/18  . EKG 12-Lead  . EKG 12-Lead  . EKG    IMPRESSION AND PLAN:  Principal Problem:   Cellulitis and abscess of upper extremity -IV antibiotics in place, PRN analgesia, monitor for improvement with a plan to switch to p.o. antibiotics once his white count improves and his symptoms improve  Chart review performed and case discussed with ED provider. Labs, imaging and/or ECG reviewed by provider and discussed with patient/family. Management plans discussed with the patient and/or family.  DVT PROPHYLAXIS: SubQ  lovenox   GI PROPHYLAXIS:  None  ADMISSION STATUS: Inpatient     CODE STATUS: Full Code Status History    Date Active Date Inactive Code Status Order ID Comments User Context   01/31/2018 2138 02/02/2018 1537 Full Code [id number]  Audery Amellapacs, John T, MD Inpatient   07/27/2017 0226 07/28/2017 1931 Full Code [id number]  Oralia ManisWillis, Alexandros Ewan, MD ED      TOTAL TIME TAKING CARE OF THIS PATIENT: 45 minutes.   Maddalena Linarez FIELDING 04/14/2018, 9:12 PM  [facility]  Office  [phone]  CC: Primary care physician; Patient, No Pcp Per  Note:  This document was prepared using Dragon voice recognition software and may include unintentional dictation errors.

## 2018-04-14 NOTE — Consult Note (Signed)
Pharmacy Antibiotic Note  Michael LovingsRobert J [name] is a 47 y.o. male admitted on 04/14/2018 with cellulitis.  Pharmacy has been consulted for vancomycin dosing. He was admitted here at [facility] earlier in the year where he received vancomycin therapy, including drug levels obtained. The following was calculated using that information:  Plan: Vancomycin 1250mg  IV every 8 hours beginning 6 hours after 1st 1000mg  dose.  Ke: 0.107  Vd: 61.9L  T1/2: 6.5h  Css: 33.4/16.3 mcg/mL    Goal trough 15-20 mcg/mL  Vt prior to 4th dose  Height: 6\' 3"  (190.5 cm) Weight: 195 lb (88.5 kg) IBW/kg (Calculated) : 84.5  Temp (24hrs), Avg:99.6 F (37.6 C), Min:99.3 F (37.4 C), Max:99.8 F (37.7 C)  Recent Labs  Lab 04/14/18 1551 04/14/18 1609  WBC 16.0*  --   CREATININE 1.04  --   LATICACIDVEN  --  1.69    Estimated Creatinine Clearance: 102.7 mL/min (by C-G formula based on SCr of 1.04 mg/dL).    No Known Allergies  Antimicrobials this admission: vancomycin 12/26 >>  ceftriaxone 12/26 x1   Microbiology results: 12/26 BCx: pending  Thank you for allowing pharmacy to be a part of this patient's care.  Lowella Bandyodney D Jaziel Bennett, PharmD 04/14/2018 6:05 PM

## 2018-04-14 NOTE — ED Provider Notes (Signed)
[facility] Emergency Department Provider Note ____________________________________________   First MD Initiated Contact with Patient 04/14/18 1732     (approximate)  I have reviewed the triage vital signs and the nursing notes.   HISTORY  Chief Complaint Wound Infection    HPI Michael [name] is a 47 y.o. male with PMH as noted below who presents with swelling and pain to his right forearm, gradual onset over the last week, worsening course, and associated with redness.  He states that the arm feels hot and tight.  He had a wound on the wrist that was draining pus although this has subsided.  The patient was working in the yard last week and thinks he might have had an abrasion or scrape.  He reports having similar cellulitis and an abscess that needed to be drained in the left forearm earlier this year.  The patient denies fever but he states that the pain is spreading up the arm and into his armpit, and he has chills and malaise.   Past Medical History:  Diagnosis Date  . Patient denies medical problems     Patient Active Problem List   Diagnosis Date Noted  . 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 Cellulitis and abscess of upper extremity 07/27/2017    Past Surgical History:  Procedure Laterality Date  . NO PAST SURGERIES      Prior to Admission medications   Medication Sig Start Date End Date Taking? Authorizing Provider  OLANZapine (ZYPREXA) 10 MG tablet Take 1 tablet (10 mg total) by mouth at bedtime. 02/02/18   McNew, Ileene HutchinsonHolly R, MD  predniSONE (DELTASONE) 20 MG tablet Take 1 tablet (20 mg total) by mouth daily. 03/12/18   Payton Mccallumonty, [place], MD    Allergies Patient has no known allergies.  Family History  Problem Relation Age of Onset  . Heart failure Mother     Social History Social History   Tobacco Use  . Smoking status: Never Smoker  . Smokeless tobacco:  Never Used  Substance Use Topics  . Alcohol use: Yes  . Drug use: Yes    Types: Marijuana, Methamphetamines    Review of Systems  Constitutional: Positive for chills. Eyes: No redness. ENT: No sore throat. Cardiovascular: Denies chest pain. Respiratory: Denies shortness of breath. Gastrointestinal: No vomiting or diarrhea.  Genitourinary: Negative for flank pain.  Musculoskeletal: Negative for back pain. Skin: Positive for rash. Neurological: Negative for headaches.  ____________________________________________   PHYSICAL EXAM:  VITAL SIGNS: ED Triage Vitals  Enc Vitals Group     BP 04/14/18 1547 125/74     Pulse Rate 04/14/18 1547 (!) 109     Resp 04/14/18 1547 20     Temp 04/14/18 1547 99.8 F (37.7 C)     Temp Source 04/14/18 1547 Oral     SpO2 04/14/18 1547 100 %     Weight 04/14/18 1548 195 lb (88.5 kg)     Height 04/14/18 1548 6\' 3"  (1.905 m)     Head Circumference --      Peak Flow --      Pain Score 04/14/18 1547 7     Pain Loc --      Pain Edu? --      Excl. in GC? --     Constitutional: Alert and oriented.  Relatively well appearing and in no acute distress. Eyes: Conjunctivae are normal.  Head: Atraumatic. Nose: No congestion/rhinnorhea. Mouth/Throat: Mucous membranes are moist.  Neck: Normal range of motion.  Cardiovascular:  Good peripheral circulation. Respiratory: Normal respiratory effort.   Gastrointestinal: No distention.  Musculoskeletal: No lower extremity edema.  Extremities warm and well perfused.  Right forearm with moderate swelling, with erythema and induration distally.  Healing pustule to distal lateral forearm with some mild fluctuance.  2+ radial pulse.  Intact motor strength and sensation to the distal arm, wrist, and hand.  FROM at elbow and wrist.  Patient able to move fingers without difficulty.  Cap refill <2 seconds distally. Neurologic:  Normal speech and language. No gross focal neurologic deficits are appreciated.  Skin:   Skin is warm and dry.  Psychiatric: Mood and affect are normal. Speech and behavior are normal.  ____________________________________________   LABS (all labs ordered are listed, but only abnormal results are displayed)  Labs Reviewed  CBC WITH DIFFERENTIAL/PLATELET - Abnormal; Notable for the following components:      Result Value   WBC 16.0 (*)    Neutro Abs 12.8 (*)    Monocytes Absolute 1.6 (*)    All other components within normal limits  COMPREHENSIVE METABOLIC PANEL - Abnormal; Notable for the following components:   Glucose, Bld 113 (*)    Total Bilirubin 1.4 (*)    All other components within normal limits  CULTURE, BLOOD (SINGLE)  CULTURE, BLOOD (SINGLE)  CULTURE, BLOOD (ROUTINE X 2)  CULTURE, BLOOD (ROUTINE X 2)  I-STAT CG4 LACTIC ACID, ED  CG4 I-STAT (LACTIC ACID)   ____________________________________________  EKG   ____________________________________________  RADIOLOGY    ____________________________________________   PROCEDURES  Procedure(s) performed: No  Procedures  Critical Care performed: No ____________________________________________   INITIAL IMPRESSION / ASSESSMENT AND PLAN / ED COURSE  Pertinent labs & imaging results that were available during my care of the patient were reviewed by me and considered in my medical decision making (see chart for details).  47 year old male with PMH as noted above presents with pain, swelling, and rash to right forearm with some draining pus from a wound there.  The patient was evaluated in urgent care and referred to the emergency department for cellulitis and concern for sepsis.  I reviewed the past medical records in Epic.  The patient was seen in the ED and admitted for cellulitis in the left forearm in April of this year.  He required an I&D at that time.  On exam today, his vital signs are normal except for low-grade temperature.  He is overall well-appearing.  He has moderate swelling in the  right forearm especially proximally, and has increasing erythema and induration distally.  There is a small healing wound distally which she reports had drained pus.  His distal sensation and motor strength is intact and he is able to move his fingers on my exam.  He has an intact radial pulse and distal cap refill.  The compartments in the forearm are tender but soft on my exam.  I performed a bedside ultrasound and there is a minimal amount of subcutaneous fluid seen in the vicinity of the small distal wound consistent with pus, however given the minimal amount there is no indication for I&D, and given the presence of a nearby nerve and vessels, I would like to avoid unnecessary injury with attempted I&D.  Presentation is consistent with cellulitis.  There is no evidence of compartment syndrome or other acute complication.  Given the size, extent, and relatively rapid onset of the symptoms I think the patient will likely need admission for IV  antibiotics.  We obtained labs from triage which are normal except for elevated WBC count.  I will order empiric antibiotics, blood cultures, analgesia, and plan for admission.  ----------------------------------------- 8:28 PM on 04/14/2018 -----------------------------------------  I signed the patient out to the hospitalist Dr. Anne HahnWillis. ____________________________________________   FINAL CLINICAL IMPRESSION(S) / ED DIAGNOSES  Final diagnoses:  Cellulitis of right upper extremity      NEW MEDICATIONS STARTED DURING THIS VISIT:  New Prescriptions   No medications on file     Note:  This document was prepared using Dragon voice recognition software and may include unintentional dictation errors.    Dionne BucySiadecki, Cara Aguino, MD 04/14/18 2029

## 2018-04-14 NOTE — ED Triage Notes (Addendum)
Pt here with significant swelling and pain to right hand/forearm for 1 week.  Redness present.  Admitted 4 days last year for similar.  Was working in yard end last week and unsure if got bit by something or what happened.  Pt has swelling to lymph nodes in right axilla.  No known fever at home.  Unable to move fingers from swelling.  Able to palpate radial pulse on right arm.  Swelling noted through elbow.  Warm to touch.  Firm swelling, but still soft along ulnar side of arm at this time.

## 2018-04-15 ENCOUNTER — Inpatient Hospital Stay: Payer: Medicaid Other

## 2018-04-15 ENCOUNTER — Other Ambulatory Visit: Payer: Self-pay

## 2018-04-15 LAB — BASIC METABOLIC PANEL
Anion gap: 7 (ref 5–15)
BUN: 10 mg/dL (ref 6–20)
CO2: 25 mmol/L (ref 22–32)
Calcium: 8.5 mg/dL — ABNORMAL LOW (ref 8.9–10.3)
Chloride: 105 mmol/L (ref 98–111)
Creatinine, Ser: 0.83 mg/dL (ref 0.61–1.24)
GFR calc Af Amer: >60 mL/min (ref >60)
GFR calc non Af Amer: >60 mL/min (ref >60)
Glucose, Bld: 135 mg/dL — ABNORMAL HIGH (ref 70–99)
Potassium: 4 mmol/L (ref 3.5–5.1)
Sodium: 137 mmol/L (ref 135–145)

## 2018-04-15 LAB — VANCOMYCIN, TROUGH: Vancomycin Tr: 13 ug/mL — ABNORMAL LOW (ref 15–20)

## 2018-04-15 LAB — CBC
HCT: 37.1 % — ABNORMAL LOW (ref 39.0–52.0)
HEMOGLOBIN: 12.4 g/dL — AB (ref 13.0–17.0)
MCH: 31.8 pg (ref 26.0–34.0)
MCHC: 33.4 g/dL (ref 30.0–36.0)
MCV: 95.1 fL (ref 80.0–100.0)
Platelets: 193 10*3/uL (ref 150–400)
RBC: 3.9 MIL/uL — ABNORMAL LOW (ref 4.22–5.81)
RDW: 13.5 % (ref 11.5–15.5)
WBC: 13.8 10*3/uL — ABNORMAL HIGH (ref 4.0–10.5)
nRBC: 0 % (ref 0.0–0.2)

## 2018-04-15 MED ORDER — OXYCODONE HCL 5 MG PO TABS
5.0000 mg | ORAL_TABLET | ORAL | Status: DC | PRN
  Administered 2018-04-15 – 2018-04-18 (×11): 10 mg via ORAL
  Filled 2018-04-15 (×11): qty 2

## 2018-04-15 NOTE — Plan of Care (Signed)
  Problem: Skin Integrity: Goal: Risk for impaired skin integrity will decrease Outcome: Progressing   

## 2018-04-15 NOTE — Care Management (Signed)
Per CMS.gov- [name] is the only home health agency listed that covers patient's zip code with 3-5 star rating. RNCM will follow.

## 2018-04-15 NOTE — Progress Notes (Signed)
[facility] - [place] at [facility]   PATIENT NAME: Len BlalockRobert Wessels    MR#:  [id number]  DATE OF BIRTH:  02/22/1971  SUBJECTIVE:  CHIEF COMPLAINT:   Chief Complaint  Patient presents with  . Wound Infection   -Came in with right forearm cellulitis after bug bite.  Still with significant swelling of the forearm and the hand.  Some improvement since yesterday  REVIEW OF SYSTEMS:  Review of Systems  Constitutional: Negative for chills, fever and malaise/fatigue.  HENT: Negative for congestion, ear discharge, hearing loss and nosebleeds.   Eyes: Negative for blurred vision and double vision.  Respiratory: Negative for cough, shortness of breath and wheezing.   Cardiovascular: Negative for chest pain, palpitations and leg swelling.  Gastrointestinal: Negative for abdominal pain, constipation, diarrhea, nausea and vomiting.  Genitourinary: Negative for dysuria.  Musculoskeletal: Positive for joint pain and myalgias.  Neurological: Negative for dizziness, focal weakness, seizures, weakness and headaches.  Psychiatric/Behavioral: Negative for depression.    DRUG ALLERGIES:  No Known Allergies  VITALS:  Blood pressure 105/71, pulse 64, temperature 98.7 F (37.1 C), temperature source Oral, resp. rate 20, height 6\' 2"  (1.88 m), weight 84.5 kg, SpO2 97 %.  PHYSICAL EXAMINATION:  Physical Exam  GENERAL:  47 y.o.-year-old patient lying in the bed with no acute distress.  EYES: Pupils equal, round, reactive to light and accommodation. No scleral icterus. Extraocular muscles intact.  HEENT: Head atraumatic, normocephalic. Oropharynx and nasopharynx clear.  NECK:  Supple, no jugular venous distention. No thyroid enlargement, no tenderness.  LUNGS: Normal breath sounds bilaterally, no wheezing, rales,rhonchi or crepitation. No use of accessory muscles of respiration.  CARDIOVASCULAR: S1, S2 normal. No murmurs, rubs, or gallops.  ABDOMEN: Soft, nontender,  nondistended. Bowel sounds present. No organomegaly or mass.  EXTREMITIES: No pedal edema, cyanosis, or clubbing.  Right axillary lymph node is enlarged and tender.  Right forearm is swollen and erythematous more towards the distal and there is an indurated area without any fluctuant abscess.  Swelling on the dorsum of the right hand noted as well. NEUROLOGIC: Cranial nerves II through XII are intact. Muscle strength 5/5 in all extremities. Sensation intact. Gait not checked.  PSYCHIATRIC: The patient is alert and oriented x 3.  SKIN: No obvious rash, lesion, or ulcer.    LABORATORY PANEL:   CBC Recent Labs  Lab 04/15/18 0320  WBC 13.8*  HGB 12.4*  HCT 37.1*  PLT 193   ------------------------------------------------------------------------------------------------------------------  Chemistries  Recent Labs  Lab 04/14/18 1551 04/15/18 0320  NA 136 137  K 3.7 4.0  CL 100 105  CO2 28 25  GLUCOSE 113* 135*  BUN 12 10  CREATININE 1.04 0.83  CALCIUM 9.4 8.5*  AST 16  --   ALT 12  --   ALKPHOS 82  --   BILITOT 1.4*  --    ------------------------------------------------------------------------------------------------------------------  Cardiac Enzymes No results for input(s): TROPONINI in the last 168 hours. ------------------------------------------------------------------------------------------------------------------  RADIOLOGY:  Dg Forearm Right  Result Date: 04/15/2018 CLINICAL DATA:  RIGHT for hand swelling and cellulitis following bug bite 1 week ago. EXAM: RIGHT FOREARM - 2 VIEW COMPARISON:  None. FINDINGS: No fracture, subluxation, dislocation or radiographic evidence of osteomyelitis. Diffuse soft tissue swelling is noted. No radiopaque foreign bodies or soft tissue gas is present. IMPRESSION: Diffuse soft tissue swelling without acute bony abnormality or radiopaque foreign body. Electronically Signed   By: Harmon PierJeffrey  Hu M.D.   On: 04/15/2018 12:09   Dg Hand  Complete  Right  Result Date: 04/15/2018 CLINICAL DATA:  Acute RIGHT hand swelling/cellulitis. EXAM: RIGHT HAND - COMPLETE 3+ VIEW COMPARISON:  12/04/2004 FINDINGS: No acute fracture, subluxation or dislocation. No radiographic evidence of osteomyelitis noted. Diffuse soft tissue swelling is present. No radiopaque foreign bodies or soft tissue gas identified. A remote 5th metacarpal fracture is present. IMPRESSION: Diffuse soft tissue swelling without acute bony abnormality. Electronically Signed   By: Harmon PierJeffrey  Hu M.D.   On: 04/15/2018 12:10    EKG:   Orders placed or performed during the hospital encounter of 01/30/18  . EKG 12-Lead  . EKG 12-Lead  . EKG    ASSESSMENT AND PLAN:   47 year old male with past medical history significant for polysubstance abuse, history of admission for acute paranoia and psychosis in December 2019 presents to hospital secondary to right arm swelling after a bug bite.  1.  Right arm cellulitis-significant erythema still present.  X-rays ordered today did not show any osteomyelitis or foreign body -Continue to monitor.  Currently on vancomycin and Rocephin -If no improvement, consider doing CT of the arm tomorrow. -No signs of compartment syndrome.  Sensation and motor function intact. -Keep the arm elevated.  2.  Leukocytosis-secondary to above infection.  Improving.  Continue to monitor  3.  DVT prophylaxis-Lovenox-   Independent and ambulatory.   All the records are reviewed and case discussed with Care Management/Social Workerr. Management plans discussed with the patient, family and they are in agreement.  CODE STATUS: Full code  TOTAL TIME TAKING CARE OF THIS PATIENT: 37 minutes.   POSSIBLE D/C IN 1-2 DAYS, DEPENDING ON CLINICAL CONDITION.   Enid Baasadhika Marlise Fahr M.D on 04/15/2018 at 12:46 PM  Between 7am to 6pm - Pager - [phone]  After 6pm go to [email or web address] - password [email or web address]  [facility]  Office   [phone]  CC: Primary care physician; Patient, No Pcp Per

## 2018-04-16 DIAGNOSIS — L03119 Cellulitis of unspecified part of limb: Secondary | ICD-10-CM

## 2018-04-16 DIAGNOSIS — L02419 Cutaneous abscess of limb, unspecified: Secondary | ICD-10-CM

## 2018-04-16 LAB — BASIC METABOLIC PANEL
Anion gap: 8 (ref 5–15)
BUN: 7 mg/dL (ref 6–20)
CO2: 24 mmol/L (ref 22–32)
Calcium: 8.6 mg/dL — ABNORMAL LOW (ref 8.9–10.3)
Chloride: 103 mmol/L (ref 98–111)
Creatinine, Ser: 0.79 mg/dL (ref 0.61–1.24)
GFR calc Af Amer: >60 mL/min (ref >60)
GFR calc non Af Amer: >60 mL/min (ref >60)
Glucose, Bld: 120 mg/dL — ABNORMAL HIGH (ref 70–99)
Potassium: 3.7 mmol/L (ref 3.5–5.1)
Sodium: 135 mmol/L (ref 135–145)

## 2018-04-16 LAB — CBC
HEMATOCRIT: 38.5 % — AB (ref 39.0–52.0)
Hemoglobin: 12.9 g/dL — ABNORMAL LOW (ref 13.0–17.0)
MCH: 31.3 pg (ref 26.0–34.0)
MCHC: 33.5 g/dL (ref 30.0–36.0)
MCV: 93.4 fL (ref 80.0–100.0)
Platelets: 207 10*3/uL (ref 150–400)
RBC: 4.12 MIL/uL — ABNORMAL LOW (ref 4.22–5.81)
RDW: 13.2 % (ref 11.5–15.5)
WBC: 11.3 10*3/uL — ABNORMAL HIGH (ref 4.0–10.5)
nRBC: 0 % (ref 0.0–0.2)

## 2018-04-16 MED ORDER — MORPHINE SULFATE (PF) 2 MG/ML IV SOLN: 2.0000 mg | Freq: Once | INTRAVENOUS | Status: AC

## 2018-04-16 MED ORDER — LIDOCAINE-EPINEPHRINE 1 %-1:100000 IJ SOLN
30.0000 mL | Freq: Once | INTRAMUSCULAR | Status: AC
  Administered 2018-04-16: 30 mL via INTRADERMAL
  Filled 2018-04-16: qty 30

## 2018-04-16 MED ORDER — MORPHINE SULFATE (PF) 2 MG/ML IV SOLN
INTRAVENOUS | Status: AC
  Administered 2018-04-16: 2 mg via INTRAMUSCULAR
  Filled 2018-04-16: qty 1

## 2018-04-16 NOTE — Progress Notes (Signed)
[facility] - [place] [facility]   PATIENT NAME: Michael BlalockRobert [name]    MR#:  [id number]  DATE OF BIRTH:  07/19/1970  SUBJECTIVE:  CHIEF COMPLAINT:   Chief Complaint  Patient presents with  . Wound Infection   -Erythema and swelling of the forearm is improving however much more concentrated and tender in the distal medial part of the right forearm -Afebrile  REVIEW OF SYSTEMS:  Review of Systems  Constitutional: Negative for chills, fever and malaise/fatigue.  HENT: Negative for congestion, ear discharge, hearing loss and nosebleeds.   Eyes: Negative for blurred vision and double vision.  Respiratory: Negative for cough, shortness of breath and wheezing.   Cardiovascular: Negative for chest pain, palpitations and leg swelling.  Gastrointestinal: Negative for abdominal pain, constipation, diarrhea, nausea and vomiting.  Genitourinary: Negative for dysuria.  Musculoskeletal: Positive for joint pain and myalgias.  Neurological: Negative for dizziness, focal weakness, seizures, weakness and headaches.  Psychiatric/Behavioral: Negative for depression.    DRUG ALLERGIES:  No Known Allergies  VITALS:  Blood pressure 123/83, pulse 64, temperature 98 F (36.7 C), temperature source Oral, resp. rate 20, height 6\' 2"  (1.88 m), weight 84.5 kg, SpO2 97 %.  PHYSICAL EXAMINATION:  Physical Exam  GENERAL:  47 y.o.-year-old patient lying in the bed with no acute distress.  EYES: Pupils equal, round, reactive to light and accommodation. No scleral icterus. Extraocular muscles intact.  HEENT: Head atraumatic, normocephalic. Oropharynx and nasopharynx clear.  NECK:  Supple, no jugular venous distention. No thyroid enlargement, no tenderness.  LUNGS: Normal breath sounds bilaterally, no wheezing, rales,rhonchi or crepitation. No use of accessory muscles of respiration.  CARDIOVASCULAR: S1, S2 normal. No murmurs, rubs, or gallops.  ABDOMEN: Soft, nontender, nondistended.  Bowel sounds present. No organomegaly or mass.  EXTREMITIES: No pedal edema, cyanosis, or clubbing.  Right axillary lymph node is enlarged and tender.  Right forearm is better today but there is a swollen and erythematous tender area towards the distal part which is not fluctuant yet.  Swelling on the dorsum of the right hand is improving as well. NEUROLOGIC: Cranial nerves II through XII are intact. Muscle strength 5/5 in all extremities. Sensation intact. Gait not checked.  PSYCHIATRIC: The patient is alert and oriented x 3.  SKIN: No obvious rash, lesion, or ulcer.    LABORATORY PANEL:   CBC Recent Labs  Lab 04/16/18 0445  WBC 11.3*  HGB 12.9*  HCT 38.5*  PLT 207   ------------------------------------------------------------------------------------------------------------------  Chemistries  Recent Labs  Lab 04/14/18 1551  04/16/18 0445  NA 136   < > 135  K 3.7   < > 3.7  CL 100   < > 103  CO2 28   < > 24  GLUCOSE 113*   < > 120*  BUN 12   < > 7  CREATININE 1.04   < > 0.79  CALCIUM 9.4   < > 8.6*  AST 16  --   --   ALT 12  --   --   ALKPHOS 82  --   --   BILITOT 1.4*  --   --    < > = values in this interval not displayed.   ------------------------------------------------------------------------------------------------------------------  Cardiac Enzymes No results for input(s): TROPONINI in the last 168 hours. ------------------------------------------------------------------------------------------------------------------  RADIOLOGY:  Dg Forearm Right  Result Date: 04/15/2018 CLINICAL DATA:  RIGHT for hand swelling and cellulitis following bug bite 1 week ago. EXAM: RIGHT FOREARM - 2 VIEW COMPARISON:  None.  FINDINGS: No fracture, subluxation, dislocation or radiographic evidence of osteomyelitis. Diffuse soft tissue swelling is noted. No radiopaque foreign bodies or soft tissue gas is present. IMPRESSION: Diffuse soft tissue swelling without acute bony  abnormality or radiopaque foreign body. Electronically Signed   By: Harmon PierJeffrey  Hu M.D.   On: 04/15/2018 12:09   Dg Hand Complete Right  Result Date: 04/15/2018 CLINICAL DATA:  Acute RIGHT hand swelling/cellulitis. EXAM: RIGHT HAND - COMPLETE 3+ VIEW COMPARISON:  12/04/2004 FINDINGS: No acute fracture, subluxation or dislocation. No radiographic evidence of osteomyelitis noted. Diffuse soft tissue swelling is present. No radiopaque foreign bodies or soft tissue gas identified. A remote 5th metacarpal fracture is present. IMPRESSION: Diffuse soft tissue swelling without acute bony abnormality. Electronically Signed   By: Harmon PierJeffrey  Hu M.D.   On: 04/15/2018 12:10    EKG:   Orders placed or performed during the hospital encounter of 01/30/18  . EKG 12-Lead  . EKG 12-Lead  . EKG    ASSESSMENT AND PLAN:   47 year old male with past medical history significant for polysubstance abuse, history of admission for acute paranoia and psychosis in December 2019 presents to hospital secondary to right arm swelling after a bug bite.  1.  Right arm cellulitis-significant erythema still present.  X-rays ordered  did not show any osteomyelitis or foreign body -Continue to monitor.   - on vancomycin and Rocephin -No signs of compartment syndrome.  Sensation and motor function intact. -Keep the arm elevated.  Warm compresses -Improving erythema of the forearm but there is one concentrated area which is not fluctuant yet.  We will have general surgery take a look at it to see if patient needs I&D  2.  Leukocytosis-secondary to above infection.  Improving.  Continue to monitor  3.  DVT prophylaxis-Lovenox-   Independent and ambulatory.   All the records are reviewed and case discussed with Care Management/Social Workerr. Management plans discussed with the patient, family and they are in agreement.  CODE STATUS: Full code  TOTAL TIME TAKING CARE OF THIS PATIENT: 27 minutes.   POSSIBLE D/C IN 1-2 DAYS,  DEPENDING ON CLINICAL CONDITION.   Enid Baasadhika Matison Nuccio M.D on 04/16/2018 at 12:45 PM  Between 7am to 6pm - Pager - [phone]  After 6pm go to [email or web address] - password [email or web address]  [facility]  Office  [phone]  CC: Primary care physician; Patient, No Pcp Per

## 2018-04-16 NOTE — Consult Note (Signed)
Date of Consultation:  04/16/2018  Requesting Physician:  Enid Baas, MD  Reason for Consultation:  Right forearm abscess  History of Present Illness: Michael [name] is a 47 y.o. male admitted on 12/26 with right upper extremity cellulitis that started a week ago and progressively worsened.  He reports that he had an episode of left arm cellulitis and abscess a year ago and now it's the right arm.  Denies drug use but reports that this time he was raking leaves and saw a snake nest.  He's unsure if there was any bites or if any foreign material may have caused an abrasion or cut.  The arm became tender, swollen and red.  He did have purulent drainage after self squeezing, but notes there were areas that he could not drain himself.  He started noticing the erythema spreading up the arm and had tenderness in the right axilla and presented to the ER.  He had XR of the right forearm and hand and there was no soft tissue gas or foreign material.  He was started on IV antibiotics (Vancomycin and Ceftriaxone), and the erythema has significantly improved but there's an area of his distal forearm that remains tender and swollen.  Surgery was consulted for evaluation for possible I&D.  Past Medical History: Past Medical History:  Diagnosis Date  . Patient denies medical problems      Past Surgical History: Past Surgical History:  Procedure Laterality Date  . NO PAST SURGERIES      Home Medications: Prior to Admission medications   Medication Sig Start Date End Date Taking? Authorizing Provider  OLANZapine (ZYPREXA) 10 MG tablet Take 1 tablet (10 mg total) by mouth at bedtime. Patient not taking: Reported on 04/15/2018 02/02/18   Haskell Riling, MD  predniSONE (DELTASONE) 20 MG tablet Take 1 tablet (20 mg total) by mouth daily. Patient not taking: Reported on 04/15/2018 03/12/18   Payton Mccallum, MD    Allergies: No Known Allergies  Social History:  reports that he has never  smoked. He has never used smokeless tobacco. He reports current alcohol use. He reports current drug use. Drugs: Marijuana and Methamphetamines.   Family History: Family History  Problem Relation Age of Onset  . Heart failure Mother     Review of Systems: Review of Systems  Constitutional: Negative for chills and fever.  HENT: Negative for hearing loss.   Respiratory: Negative for shortness of breath.   Cardiovascular: Negative for chest pain.  Gastrointestinal: Negative for abdominal pain, nausea and vomiting.  Genitourinary: Negative for dysuria.  Musculoskeletal: Negative for myalgias.  Skin:       Worsening erythema and tenderness.  Neurological: Negative for dizziness.  Psychiatric/Behavioral: Negative for depression.    Physical Exam BP 122/86 (BP Location: Left Arm)   Pulse 74   Temp 98.6 F (37 C) (Oral)   Resp 18   Ht 6\' 2"  (1.88 m)   Wt 84.5 kg   SpO2 100%   BMI 23.92 kg/m  CONSTITUTIONAL: No acute distress HEENT:  Normocephalic, atraumatic, extraocular motion intact. NECK: Trachea is midline, and there is no jugular venous distension. RESPIRATORY:  Lungs are clear, and breath sounds are equal bilaterally. Normal respiratory effort without pathologic use of accessory muscles. CARDIOVASCULAR: Heart is regular without murmurs, gallops, or rubs. GI: The abdomen is soft, nondistended, nontender.  MUSCULOSKELETAL:  Normal muscle strength and tone in all four extremities.  No peripheral edema or cyanosis. SKIN: Right distal forearm with a significant area of  cellulitis and induration measuring approximately 10 cm x 5 cm in size.  There is fluctuance in the middle of this area, at the point where he had been spontaneously draining before.  No active drainage at this point. NEUROLOGIC:  Motor and sensation is grossly normal.  Cranial nerves are grossly intact. PSYCH:  Alert and oriented to person, place and time. Affect is normal.  Laboratory Analysis: Results for  orders placed or performed during the hospital encounter of 04/14/18 (from the past 24 hour(s))  Vancomycin, trough     Status: Abnormal   Collection Time: 04/15/18 11:27 PM  Result Value Ref Range   Vancomycin Tr 13 (L) 15 - 20 ug/mL  CBC     Status: Abnormal   Collection Time: 04/16/18  4:45 AM  Result Value Ref Range   WBC 11.3 (H) 4.0 - 10.5 K/uL   RBC 4.12 (L) 4.22 - 5.81 MIL/uL   Hemoglobin 12.9 (L) 13.0 - 17.0 g/dL   HCT 40.938.5 (L) 81.139.0 - 91.452.0 %   MCV 93.4 80.0 - 100.0 fL   MCH 31.3 26.0 - 34.0 pg   MCHC 33.5 30.0 - 36.0 g/dL   RDW 78.213.2 95.611.5 - 21.315.5 %   Platelets 207 150 - 400 K/uL   nRBC 0.0 0.0 - 0.2 %  Basic metabolic panel     Status: Abnormal   Collection Time: 04/16/18  4:45 AM  Result Value Ref Range   Sodium 135 135 - 145 mmol/L   Potassium 3.7 3.5 - 5.1 mmol/L   Chloride 103 98 - 111 mmol/L   CO2 24 22 - 32 mmol/L   Glucose, Bld 120 (H) 70 - 99 mg/dL   BUN 7 6 - 20 mg/dL   Creatinine, Ser 0.860.79 0.61 - 1.24 mg/dL   Calcium 8.6 (L) 8.9 - 10.3 mg/dL   GFR calc non Af Amer >60 >60 mL/min   GFR calc Af Amer >60 >60 mL/min   Anion gap 8 5 - 15    Imaging: XR forearm and hand: FINDINGS: No acute fracture, subluxation or dislocation. No radiographic evidence of osteomyelitis noted. Diffuse soft tissue swelling is present. No radiopaque foreign bodies or soft tissue gas identified. A remote 5th metacarpal fracture is present.  IMPRESSION: Diffuse soft tissue swelling without acute bony abnormality.  Assessment and Plan: This is a 47 y.o. male with right distal upper extremity cellulitis and abscess.  Discussed with the patient that he does have a remaining abscess even though the cellulitis has improved and this would require a drainage procedure.  The patient reports that he has had 1 in the past on the opposite arm he is willing to proceed.  Did discuss with him the risks of bleeding, infection, injury to surrounding structures and need for further procedures.   We will be doing this at bedside.  I did offer the patient to do this in the operating room if he wanted to but he was happy to do it at bedside instead.   Procedure Date:  04/16/2018  Pre-operative Diagnosis: Right distal forearm abscess  Post-operative Diagnosis: Right distal forearm abscess  Procedure:  Incision and Drainage of right distal forearm abscess  Surgeon:  Howie IllJose Luis Keyli Duross, MD  Anesthesia: 5 mL of 1% lidocaine with epi  Estimated Blood Loss: 4 ml  Specimens: Culture swab  Complications: None  Indications for Procedure:  This is a 47 y.o. male with diagnosis of right distal forearm abscess, requiring drainage procedure.  The risks of bleeding, abscess  or infection, injury to surrounding structures, and need for further procedures were all discussed with the patient and was willing to proceed.  Description of Procedure: The patient was correctly identified at bedside.  Appropriate time-outs were performed prior to procedure.  The patient's right distal forearm was prepped and draped in usual sterile fashion.  Local anesthetic was infused intradermally.  A cruciate 1.5 cm incision was made over the abscess, revealing purulent fluid.  This fluid was swabbed for culture and sent to micro.  Small Kelly forceps were used to dissect around the abscess tissue to open any remaining pockets of purulent fluid.  After drainage was completed, the cavity was irrigated and cleaned.  The wound was packed with 1/4 inch gauze and covered with dry gauze and tape.  The patient tolerated the procedure well and all sharps were appropriately disposed of at the end of the case.  Shonna Chock- Wick left in place and covered with dry gauze.  The exterior dry gauze can be replaced as needed based on saturation. - Continue IV antibiotics and cultures are pending at this point. - We will continue to follow with you.   Howie IllJose Luis Dannae Kato, MD [facility] Pg:  [phone]

## 2018-04-16 NOTE — Consult Note (Signed)
Pharmacy Antibiotic Note  Michael LovingsRobert J Smigiel is a 47 y.o. male admitted on 04/14/2018 with cellulitis.  Pharmacy has been consulted for vancomycin dosing. He was admitted [facility] at [facility] earlier in the year where he received vancomycin therapy, including drug levels obtained. The following was calculated using that information:              Ke: 0.107  Vd: 61.9L  T1/2: 6.5h  Css: 33.4/16.3 mcg/mL  Currently ordered Vancomycin 1250mg  IV q8h  12/27 23:30 Vancomycin trough of 13 mcg/ml  Plan: Will continue Vancomycin 1250mg  IV every 8 hours, trough of 13 mcg/ml should be adequate for cellulitis.  Height: 6\' 2"  (188 cm) Weight: 186 lb 4.6 oz (84.5 kg) IBW/kg (Calculated) : 82.2  Temp (24hrs), Avg:98.9 F (37.2 C), Min:98.7 F (37.1 C), Max:99 F (37.2 C)  Recent Labs  Lab 04/14/18 1551 04/14/18 1609 04/15/18 0320 04/15/18 2327  WBC 16.0*  --  13.8*  --   CREATININE 1.04  --  0.83  --   LATICACIDVEN  --  1.69  --   --   VANCOTROUGH  --   --   --  13*    Estimated Creatinine Clearance: 125.2 mL/min (by C-G formula based on SCr of 0.83 mg/dL).    No Known Allergies  Antimicrobials this admission: vancomycin 12/26 >>  ceftriaxone 12/26 x1   Microbiology results: 12/26 BCx: pending  Thank you for allowing pharmacy to be a part of this patient's care.  Clovia CuffLisa Ramadan Couey, PharmD, BCPS 04/16/2018 12:39 AM

## 2018-04-17 LAB — CBC
HCT: 40 % (ref 39.0–52.0)
Hemoglobin: 13.3 g/dL (ref 13.0–17.0)
MCH: 31.2 pg (ref 26.0–34.0)
MCHC: 33.3 g/dL (ref 30.0–36.0)
MCV: 93.9 fL (ref 80.0–100.0)
Platelets: 229 K/uL (ref 150–400)
RBC: 4.26 MIL/uL (ref 4.22–5.81)
RDW: 13 % (ref 11.5–15.5)
WBC: 9.7 K/uL (ref 4.0–10.5)
nRBC: 0 % (ref 0.0–0.2)

## 2018-04-17 LAB — VANCOMYCIN, TROUGH: Vancomycin Tr: 15 ug/mL (ref 15–20)

## 2018-04-17 NOTE — Plan of Care (Signed)
  Problem: Clinical Measurements: Goal: Will remain free from infection Outcome: Progressing   Problem: Pain Managment: Goal: General experience of comfort will improve Outcome: Progressing   Problem: Skin Integrity: Goal: Risk for impaired skin integrity will decrease Outcome: Progressing   Problem: Clinical Measurements: Goal: Ability to avoid or minimize complications of infection will improve Outcome: Progressing   Problem: Skin Integrity: Goal: Skin integrity will improve Outcome: Progressing

## 2018-04-17 NOTE — Progress Notes (Signed)
Pharmacy Antibiotic Note Michael LovingsRobert J [name] is a 47 y.o. male admitted on 04/14/2018 with cellulitis.  Pharmacy has been consulted for vancomycin dosing. He was admitted [facility] at [facility] earlier in the year where he received vancomycin therapy, including drug levels obtained. The following was calculated using that information:              Ke: 0.107             Vd: 61.9L             T1/2: 6.5h             Css: 33.4/16.3 mcg/mL  Currently ordered Vancomycin 1250mg  IV q8h  12/27 23:30 Vancomycin trough of 13 mcg/ml  Plan: Will continue Vancomycin 1250mg  IV every 8 hours, trough of 13 mcg/ml should be adequate for cellulitis.  12/29:  Vanc trough @ 1548 = 15 mcg/mL Will continue pt on Vancomycin 1250 mg IV Q8H.   Height: 6\' 2"  (188 cm) Weight: 186 lb 4.6 oz (84.5 kg) IBW/kg (Calculated) : 82.2  Temp (24hrs), Avg:98.9 F (37.2 C), Min:98.7 F (37.1 C), Max:99 F (37.2 C)  LastLabs        Recent Labs  Lab 04/14/18 1551 04/14/18 1609 04/15/18 0320 04/15/18 2327  WBC 16.0*  --  13.8*  --   CREATININE 1.04  --  0.83  --   LATICACIDVEN  --  1.69  --   --   VANCOTROUGH  --   --   --  13*      Estimated Creatinine Clearance: 125.2 mL/min (by C-G formula based on SCr of 0.83 mg/dL).    No Known Allergies  Antimicrobials this admission: vancomycin 12/26 >>  ceftriaxone 12/26 x1   Microbiology results: 12/26 BCx: pending  Michael [name] D 04/17/2018 4:53 PM

## 2018-04-17 NOTE — Progress Notes (Signed)
SURGICAL PROGRESS NOTE   Hospital Day(s): 3.   Post op day(s):  Marland Kitchen.   Interval History: Patient seen and examined, no acute events or new complaints overnight. Patient reports significant pain on the right forearm but refers is able to close hand better, denies fever or chills.  Vital signs in last 24 hours: [min-max] current  Temp:  [97.9 F (36.6 C)-98.6 F (37 C)] 97.9 F (36.6 C) (12/29 0718) Pulse Rate:  [71-78] 71 (12/29 0718) Resp:  [18-20] 18 (12/29 0718) BP: (109-122)/(71-86) 109/71 (12/29 0718) SpO2:  [96 %-100 %] 98 % (12/29 0718)     Height: 6\' 2"  (188 cm) Weight: 84.5 kg BMI (Calculated): 23.91    Physical Exam:  Constitutional: alert, cooperative and no distress  Extremity: right forearm wound with significant erythema, and swelling.As reported by patient swelling with minimal improvement. No purulent secretions.   Labs:  CBC Latest Ref Rng & Units 04/17/2018 04/16/2018 04/15/2018  WBC 4.0 - 10.5 K/uL 9.7 11.3(H) 13.8(H)  Hemoglobin 13.0 - 17.0 g/dL 16.113.3 12.9(L) 12.4(L)  Hematocrit 39.0 - 52.0 % 40.0 38.5(L) 37.1(L)  Platelets 150 - 400 K/uL 229 207 193   CMP Latest Ref Rng & Units 04/16/2018 04/15/2018 04/14/2018  Glucose 70 - 99 mg/dL 096(E120(H) 454(U135(H) 981(X113(H)  BUN 6 - 20 mg/dL 7 10 12   Creatinine 0.61 - 1.24 mg/dL 9.140.79 7.820.83 9.561.04  Sodium 135 - 145 mmol/L 135 137 136  Potassium 3.5 - 5.1 mmol/L 3.7 4.0 3.7  Chloride 98 - 111 mmol/L 103 105 100  CO2 22 - 32 mmol/L 24 25 28   Calcium 8.9 - 10.3 mg/dL 2.1(H8.6(L) 0.8(M8.5(L) 9.4  Total Protein 6.5 - 8.1 g/dL - - 7.5  Total Bilirubin 0.3 - 1.2 mg/dL - - 1.4(H)  Alkaline Phos 38 - 126 U/L - - 82  AST 15 - 41 U/L - - 16  ALT 0 - 44 U/L - - 12    Imaging studies: No new pertinent imaging studies   Assessment/Plan:  47 y.o. male with left forearm abscess status post incision and drainage. Swelling improving but still significant. Also the cellulitis and pain is significant. I recommend continue IV antibiotic therapy and  local care for at least another 24 hours.   Gae GallopEdgardo Cintrn-Daz, MD

## 2018-04-17 NOTE — Progress Notes (Signed)
[facility] [place] at [facility]   PATIENT NAME: Michael BlalockRobert [name]    MR#:  [id number]  DATE OF BIRTH:  06/05/1970  SUBJECTIVE:  CHIEF COMPLAINT:   Chief Complaint  Patient presents with  . Wound Infection   -Still complaining of significant pain.  Status post incision and drainage of the right forearm abscess yesterday.  Packing in place  REVIEW OF SYSTEMS:  Review of Systems  Constitutional: Negative for chills, fever and malaise/fatigue.  HENT: Negative for congestion, ear discharge, hearing loss and nosebleeds.   Eyes: Negative for blurred vision and double vision.  Respiratory: Negative for cough, shortness of breath and wheezing.   Cardiovascular: Negative for chest pain, palpitations and leg swelling.  Gastrointestinal: Negative for abdominal pain, constipation, diarrhea, nausea and vomiting.  Genitourinary: Negative for dysuria.  Musculoskeletal: Positive for joint pain and myalgias.  Neurological: Negative for dizziness, focal weakness, seizures, weakness and headaches.  Psychiatric/Behavioral: Negative for depression.    DRUG ALLERGIES:  No Known Allergies  VITALS:  Blood pressure 109/71, pulse 71, temperature 97.9 F (36.6 C), temperature source Oral, resp. rate 18, height 6\' 2"  (1.88 m), weight 84.5 kg, SpO2 98 %.  PHYSICAL EXAMINATION:  Physical Exam  GENERAL:  47 y.o.-year-old patient lying in the bed with no acute distress.  EYES: Pupils equal, round, reactive to light and accommodation. No scleral icterus. Extraocular muscles intact.  HEENT: Head atraumatic, normocephalic. Oropharynx and nasopharynx clear.  NECK:  Supple, no jugular venous distention. No thyroid enlargement, no tenderness.  LUNGS: Normal breath sounds bilaterally, no wheezing, rales,rhonchi or crepitation. No use of accessory muscles of respiration.  CARDIOVASCULAR: S1, S2 normal. No murmurs, rubs, or gallops.  ABDOMEN: Soft, nontender, nondistended. Bowel sounds  present. No organomegaly or mass.  EXTREMITIES: No pedal edema, cyanosis, or clubbing.  Right axillary lymph node is enlarged and tender.  Right forearm swelling is better, however indurated spot on the distal medial border-status post packing in place.  With significant tenderness and indurated  NEUROLOGIC: Cranial nerves II through XII are intact. Muscle strength 5/5 in all extremities. Sensation intact. Gait not checked.  PSYCHIATRIC: The patient is alert and oriented x 3.  SKIN: No obvious rash, lesion, or ulcer.    LABORATORY PANEL:   CBC Recent Labs  Lab 04/17/18 0433  WBC 9.7  HGB 13.3  HCT 40.0  PLT 229   ------------------------------------------------------------------------------------------------------------------  Chemistries  Recent Labs  Lab 04/14/18 1551  04/16/18 0445  NA 136   < > 135  K 3.7   < > 3.7  CL 100   < > 103  CO2 28   < > 24  GLUCOSE 113*   < > 120*  BUN 12   < > 7  CREATININE 1.04   < > 0.79  CALCIUM 9.4   < > 8.6*  AST 16  --   --   ALT 12  --   --   ALKPHOS 82  --   --   BILITOT 1.4*  --   --    < > = values in this interval not displayed.   ------------------------------------------------------------------------------------------------------------------  Cardiac Enzymes No results for input(s): TROPONINI in the last 168 hours. ------------------------------------------------------------------------------------------------------------------  RADIOLOGY:  No results found.  EKG:   Orders placed or performed during the hospital encounter of 01/30/18  . EKG 12-Lead  . EKG 12-Lead  . EKG    ASSESSMENT AND PLAN:   47 year old male with past medical history significant for polysubstance abuse,  history of admission for acute paranoia and psychosis in December 2019 presents to hospital secondary to right arm swelling after a bug bite.  1.  Right arm cellulitis-significant erythema still present.  X-rays ordered  did not show any  osteomyelitis or foreign body - not improving-significant induration status post I&D yesterday.  Still packing in place. -Erythema, tenderness and induration still present -Appreciate surgical consult -Continue vancomycin and Rocephin -dressing changes daily -Keep the arm elevated.  Warm compresses  2.  Leukocytosis-secondary to above infection.  Improving.   -Continue to monitor  3.  DVT prophylaxis-Lovenox-   Independent and ambulatory.   All the records are reviewed and case discussed with Care Management/Social Workerr. Management plans discussed with the patient, family and they are in agreement.  CODE STATUS: Full code  TOTAL TIME TAKING CARE OF THIS PATIENT: 28 minutes.   POSSIBLE D/C IN 1-2 DAYS, DEPENDING ON CLINICAL CONDITION.   Enid Baasadhika Deloise Marchant M.D on 04/17/2018 at 1:09 PM  Between 7am to 6pm - Pager - [phone]  After 6pm go to [email or web address] - password [email or web address]  [facility]  Office  [phone]  CC: Primary care physician; Patient, No Pcp Per

## 2018-04-18 ENCOUNTER — Encounter
Admission: EM | Disposition: A | Discharge status: Home / Self Care | Payer: Self-pay | Source: Home / Self Care | Attending: Internal Medicine

## 2018-04-18 ENCOUNTER — Inpatient Hospital Stay: Payer: Medicaid Other | Admitting: Certified Registered Nurse Anesthetist

## 2018-04-18 ENCOUNTER — Encounter: Payer: Self-pay | Admitting: *Deleted

## 2018-04-18 HISTORY — PX: IRRIGATION AND DEBRIDEMENT ABSCESS: SHX5252

## 2018-04-18 LAB — BASIC METABOLIC PANEL
Anion gap: 8 (ref 5–15)
BUN: 13 mg/dL (ref 6–20)
CO2: 25 mmol/L (ref 22–32)
Calcium: 8.9 mg/dL (ref 8.9–10.3)
Chloride: 103 mmol/L (ref 98–111)
Creatinine, Ser: 0.86 mg/dL (ref 0.61–1.24)
GFR calc Af Amer: >60 mL/min (ref >60)
GFR calc non Af Amer: >60 mL/min (ref >60)
Glucose, Bld: 120 mg/dL — ABNORMAL HIGH (ref 70–99)
Potassium: 4 mmol/L (ref 3.5–5.1)
Sodium: 136 mmol/L (ref 135–145)

## 2018-04-18 SURGERY — IRRIGATION AND DEBRIDEMENT ABSCESS
Anesthesia: General | Site: Arm Lower | Laterality: Right

## 2018-04-18 SURGERY — IRRIGATION AND DEBRIDEMENT ABSCESS
Anesthesia: Choice | Laterality: Right

## 2018-04-18 MED ORDER — MEPERIDINE HCL 50 MG/ML IJ SOLN: 6.2500 mg | INTRAMUSCULAR | Status: DC | PRN

## 2018-04-18 MED ORDER — FENTANYL CITRATE (PF) 100 MCG/2ML IJ SOLN
INTRAMUSCULAR | Status: AC
  Administered 2018-04-18: 25 ug via INTRAVENOUS
  Filled 2018-04-18: qty 2

## 2018-04-18 MED ORDER — DIPHENHYDRAMINE HCL 25 MG PO CAPS
25.0000 mg | ORAL_CAPSULE | Freq: Three times a day (TID) | ORAL | Status: DC | PRN
  Administered 2018-04-18: 25 mg via ORAL
  Filled 2018-04-18: qty 1

## 2018-04-18 MED ORDER — FENTANYL CITRATE (PF) 100 MCG/2ML IJ SOLN
INTRAMUSCULAR | Status: AC
  Filled 2018-04-18: qty 2

## 2018-04-18 MED ORDER — LIDOCAINE HCL (CARDIAC) PF 100 MG/5ML IV SOSY
PREFILLED_SYRINGE | INTRAVENOUS | Status: DC | PRN
  Administered 2018-04-18: 100 mg via INTRAVENOUS

## 2018-04-18 MED ORDER — SODIUM CHLORIDE FLUSH 0.9 % IV SOLN
INTRAVENOUS | Status: AC
  Filled 2018-04-18: qty 10

## 2018-04-18 MED ORDER — PROMETHAZINE HCL 25 MG/ML IJ SOLN: 6.2500 mg | INTRAMUSCULAR | Status: DC | PRN

## 2018-04-18 MED ORDER — LIDOCAINE HCL (PF) 2 % IJ SOLN
INTRAMUSCULAR | Status: AC
  Filled 2018-04-18: qty 10

## 2018-04-18 MED ORDER — FENTANYL CITRATE (PF) 100 MCG/2ML IJ SOLN
25.0000 ug | INTRAMUSCULAR | Status: AC | PRN
  Administered 2018-04-18 (×6): 25 ug via INTRAVENOUS

## 2018-04-18 MED ORDER — FENTANYL CITRATE (PF) 100 MCG/2ML IJ SOLN
25.0000 ug | INTRAMUSCULAR | Status: AC | PRN
  Administered 2018-04-18 (×2): 25 ug via INTRAVENOUS

## 2018-04-18 MED ORDER — PROPOFOL 10 MG/ML IV BOLUS
INTRAVENOUS | Status: AC
  Filled 2018-04-18: qty 20

## 2018-04-18 MED ORDER — MIDAZOLAM HCL 2 MG/2ML IJ SOLN
INTRAMUSCULAR | Status: AC
  Filled 2018-04-18: qty 2

## 2018-04-18 MED ORDER — MIDAZOLAM HCL 2 MG/2ML IJ SOLN
INTRAMUSCULAR | Status: DC | PRN
  Administered 2018-04-18: 2 mg via INTRAVENOUS

## 2018-04-18 MED ORDER — DEXAMETHASONE SODIUM PHOSPHATE 10 MG/ML IJ SOLN
INTRAMUSCULAR | Status: AC
  Filled 2018-04-18: qty 1

## 2018-04-18 MED ORDER — ONDANSETRON HCL 4 MG/2ML IJ SOLN
INTRAMUSCULAR | Status: AC
  Filled 2018-04-18: qty 2

## 2018-04-18 MED ORDER — BUPIVACAINE-EPINEPHRINE (PF) 0.25% -1:200000 IJ SOLN
INTRAMUSCULAR | Status: AC
  Filled 2018-04-18: qty 30

## 2018-04-18 MED ORDER — BUPIVACAINE-EPINEPHRINE 0.25% -1:200000 IJ SOLN
INTRAMUSCULAR | Status: DC | PRN
  Administered 2018-04-18: 30 mL

## 2018-04-18 MED ORDER — FENTANYL CITRATE (PF) 100 MCG/2ML IJ SOLN
INTRAMUSCULAR | Status: DC | PRN
  Administered 2018-04-18 (×2): 25 ug via INTRAVENOUS
  Administered 2018-04-18 (×3): 50 ug via INTRAVENOUS

## 2018-04-18 MED ORDER — FENTANYL CITRATE (PF) 100 MCG/2ML IJ SOLN: 50.0000 ug | INTRAMUSCULAR | Status: DC | PRN

## 2018-04-18 MED ORDER — PROPOFOL 10 MG/ML IV BOLUS
INTRAVENOUS | Status: DC | PRN
  Administered 2018-04-18: 200 mg via INTRAVENOUS
  Administered 2018-04-18: 40 mg via INTRAVENOUS

## 2018-04-18 MED ORDER — ONDANSETRON HCL 4 MG/2ML IJ SOLN
INTRAMUSCULAR | Status: DC | PRN
  Administered 2018-04-18: 4 mg via INTRAVENOUS

## 2018-04-18 MED ORDER — IBUPROFEN 400 MG PO TABS
600.0000 mg | ORAL_TABLET | Freq: Three times a day (TID) | ORAL | Status: DC | PRN
  Administered 2018-04-18: 600 mg via ORAL
  Filled 2018-04-18: qty 2

## 2018-04-18 MED ORDER — LACTATED RINGERS IV SOLN
INTRAVENOUS | Status: DC | PRN
  Administered 2018-04-18: 11:00:00 via INTRAVENOUS

## 2018-04-18 MED ORDER — OXYCODONE HCL 5 MG/5ML PO SOLN: 5.0000 mg | Freq: Once | ORAL | Status: DC | PRN

## 2018-04-18 MED ORDER — OXYCODONE HCL 5 MG PO TABS: 5.0000 mg | ORAL_TABLET | Freq: Once | ORAL | Status: DC | PRN

## 2018-04-18 MED ORDER — DEXAMETHASONE SODIUM PHOSPHATE 10 MG/ML IJ SOLN
INTRAMUSCULAR | Status: DC | PRN
  Administered 2018-04-18: 10 mg via INTRAVENOUS

## 2018-04-18 SURGICAL SUPPLY — 20 items
BNDG GAUZE 4.5X4.1 6PLY STRL (MISCELLANEOUS) ×3 IMPLANT
CHLORAPREP W/TINT 26ML (MISCELLANEOUS) ×3 IMPLANT
COVER WAND RF STERILE (DRAPES) ×3 IMPLANT
DRAIN PENROSE 1/4X12 LTX (DRAIN) ×3 IMPLANT
DRAPE LAPAROTOMY 77X122 PED (DRAPES) ×3 IMPLANT
DRAPE SHEET LG 3/4 BI-LAMINATE (DRAPES) ×3 IMPLANT
ELECT REM PT RETURN 9FT ADLT (ELECTROSURGICAL) ×3
ELECTRODE REM PT RTRN 9FT ADLT (ELECTROSURGICAL) ×1 IMPLANT
GAUZE SPONGE 4X4 12PLY STRL (GAUZE/BANDAGES/DRESSINGS) ×3 IMPLANT
GLOVE SURG SYN 7.0 (GLOVE) ×3 IMPLANT
GLOVE SURG SYN 7.5  E (GLOVE) ×2
GLOVE SURG SYN 7.5 E (GLOVE) ×1 IMPLANT
GOWN STRL REUS W/ TWL LRG LVL3 (GOWN DISPOSABLE) ×2 IMPLANT
GOWN STRL REUS W/TWL LRG LVL3 (GOWN DISPOSABLE) ×4
KIT TURNOVER KIT A (KITS) ×3 IMPLANT
LABEL OR SOLS (LABEL) ×3 IMPLANT
NS IRRIG 500ML POUR BTL (IV SOLUTION) ×3 IMPLANT
PACK BASIN MINOR ARMC (MISCELLANEOUS) ×3 IMPLANT
SWAB CULTURE AMIES ANAERIB BLU (MISCELLANEOUS) ×3 IMPLANT
SYR BULB IRRIG 60ML STRL (SYRINGE) ×3 IMPLANT

## 2018-04-18 NOTE — Anesthesia Post-op Follow-up Note (Signed)
Anesthesia QCDR form completed.        

## 2018-04-18 NOTE — Progress Notes (Signed)
Pt requesting Benadryl, stating "the detergent we use makes him itch". Prime doc notified. Orders received for Benadryl 25 mg Q8 PRN. Order placed.

## 2018-04-18 NOTE — Progress Notes (Addendum)
[facility] SURGICAL PROGRESS NOTE   Hospital Day(s): 4.   Post op day(s):  Marland Kitchen.   Interval History: Patient seen and examined, no acute events or new complaints overnight. Patient reports that his pain in his right upper extremity has improved but he still feels like there is a "pocket of pus" more proximally to his bedside I&D site. He denied any fevers or chills. He reports that with dressing change yesterday there was purulent drainage. Of note, he is starting to develop a whole body rash, however, he reports that he has had similar rashes his entire life.   Review of Systems:  Constitutional: denies fever, chills  Respiratory: denies any shortness of breath  Cardiovascular: denies chest pain or palpitations  Musculoskeletal: denies pain, decreased motor or sensation Integumentary: denies any other rashes or skin discolorations except right upper extremity abscess    Vital signs in last 24 hours: [min-max] current  Temp:  [97.9 F (36.6 C)-98.4 F (36.9 C)] 97.9 F (36.6 C) (12/30 0808) Pulse Rate:  [69-76] 69 (12/30 0808) Resp:  [18-20] 18 (12/30 0808) BP: (122-135)/(83-89) 122/86 (12/30 0808) SpO2:  [98 %-99 %] 99 % (12/30 0808)     Height: 6\' 2"  (188 cm) Weight: 84.5 kg BMI (Calculated): 23.91   Intake/Output this shift:  No intake/output data recorded.   Intake/Output last 2 shifts:  @IOLAST2SHIFTS @   Physical Exam:  Constitutional: alert, cooperative and no distress  HENT: normocephalic without obvious abnormality  Eyes: EOM's grossly intact and symmetric  Respiratory: breathing non-labored at rest  Musculoskeletal: Large area of erythema and swelling to the distal lateral right upper extremity. Previous I&D incision draining purulence with additional palpable fluctuance more on the more proximal portion of the erythema.    Labs:  CBC Latest Ref Rng & Units 04/17/2018 04/16/2018 04/15/2018  WBC 4.0 - 10.5 K/uL 9.7 11.3(H) 13.8(H)  Hemoglobin 13.0 -  17.0 g/dL 13.013.3 12.9(L) 12.4(L)  Hematocrit 39.0 - 52.0 % 40.0 38.5(L) 37.1(L)  Platelets 150 - 400 K/uL 229 207 193   CMP Latest Ref Rng & Units 04/18/2018 04/16/2018 04/15/2018  Glucose 70 - 99 mg/dL 865(H120(H) 846(N120(H) 629(B135(H)  BUN 6 - 20 mg/dL 13 7 10   Creatinine 0.61 - 1.24 mg/dL 2.840.86 1.320.79 4.400.83  Sodium 135 - 145 mmol/L 136 135 137  Potassium 3.5 - 5.1 mmol/L 4.0 3.7 4.0  Chloride 98 - 111 mmol/L 103 103 105  CO2 22 - 32 mmol/L 25 24 25   Calcium 8.9 - 10.3 mg/dL 8.9 1.0(U8.6(L) 7.2(Z8.5(L)  Total Protein 6.5 - 8.1 g/dL - - -  Total Bilirubin 0.3 - 1.2 mg/dL - - -  Alkaline Phos 38 - 126 U/L - - -  AST 15 - 41 U/L - - -  ALT 0 - 44 U/L - - -    Assessment/Plan:  47 y.o. male with continued purulent drainage, erythema and swelling 2 days s/p bedside incision and drainage for right upper extremity abscess   - Will plan for I&D in OR with Dr Aleen CampiPiscoya pending OR and anesthesia availability  - A/all risks, benefits, and alternatives to above procedure(s) were discussed with the patient and all of his questions were answered to his expressed satisfaction, patient expresses he wishes to proceed, and informed consent was obtained.   - NPO, IVF  - Continue IV Abx   - Paain control as needed  - Mobilize  - Discharge Planning: Hopefully home in next 24-48 hours  All of the above findings and recommendations were  discussed with the patient, and the medical team, and all of patient's questions were answered to his expressed satisfaction.   -- Lynden OxfordZachary Schulz, PA-C  [facility] 04/18/2018, 9:07 AM [phone] M-F: 7am - 4pm

## 2018-04-18 NOTE — Transfer of Care (Signed)
Immediate Anesthesia Transfer of Care Note  Patient: Michael [name]  Procedure(s) Performed: IRRIGATION AND DEBRIDEMENT ABSCESS RIGHT ARM (Right Arm Lower)  Patient Location: PACU  Anesthesia Type:General  Level of Consciousness: awake  Airway & Oxygen Therapy: Patient Spontanous Breathing  Post-op Assessment: Report given to RN  Post vital signs: stable  Last Vitals:  Vitals Value Taken Time  BP 143/88 04/18/2018 12:16 PM  Temp    Pulse 92 04/18/2018 12:19 PM  Resp 18 04/18/2018 12:19 PM  SpO2 98 % 04/18/2018 12:19 PM  Vitals shown include unvalidated device data.  Last Pain:  Vitals:   04/18/18 1009  TempSrc: Tympanic  PainSc: 8       Patients Stated Pain Goal: 2 (04/17/18 2053)  Complications: No apparent anesthesia complications

## 2018-04-18 NOTE — Op Note (Signed)
  Procedure Date:  04/18/2018  Pre-operative Diagnosis:  Right upper extremity abscess  Post-operative Diagnosis:  Right upper extremity abscess  Procedure:  Incision and Drainage of right upper extremity abscess  Surgeon:  Howie IllJose Luis Vonette Grosso, MD  Anesthesia:  General endotracheal  Estimated Blood Loss:  10 ml  Specimens:  None  Complications:  None  Indications for Procedure:  This is a 47 y.o. male with diagnosis of right forearm abscess, s/p I&D of abscess at bedside on 12/28.  He continues to have persistent purulent drainage, erythema, and induration.  On exam, the abscess cavity appears to extend more proximally and distally. Discussed with the patient that he would need repeat I&D for better drainage.  The risks of bleeding, abscess or infection, injury to surrounding structures, and need for further procedures were all discussed with the patient and was willing to proceed.  Description of Procedure: The patient was correctly identified in the preoperative area and brought into the operating room.  The patient was placed supine with VTE prophylaxis in place.  Appropriate time-outs were performed.  Anesthesia was induced and the patient was intubated.  Appropriate antibiotics were infused.  The patient's right forearm was prepped and draped in usual sterile fashion.  Local anesthetic was infused intradermally.  His prior I&D incision was explored with Kelly forceps and was noted the cavity extended significantly proximal and distal to the incision.  Kelly forceps were used to break up further pockets of purulent fluid, and more fluid was expressed.  Two more cruciate incisions were made distally and proximally.  The cavity was thoroughly irrigated through all three incisions.  Cautery was used for hemostasis.  Two pieces of 1/4 inch penrose drain were looped from distal to mid incisions and proximal to mid incisions.  Both were ligated with 2-0 silk ties without any extra tension on the  incisions.  Total of 20 ml of local anesthesic was infused along all incisions. The wound was dressed with 4x4 gauze and ABD pad and secured with tape.  The patient was then emerged from anesthesia, extubated, and brought to the recovery room for further management.  The patient tolerated the procedure well and all counts were correct at the end of the case.   Howie IllJose Luis Oaklen Thiam, MD

## 2018-04-18 NOTE — Anesthesia Preprocedure Evaluation (Signed)
Anesthesia Evaluation  Patient identified by MRN, date of birth, ID band Patient awake    Reviewed: Allergy & Precautions, NPO status , Patient's Chart, lab work & pertinent test results  History of Anesthesia Complications Negative for: history of anesthetic complications  Airway Mallampati: II  TM Distance: >3 FB Neck ROM: Full    Dental  (+) Poor Dentition   Pulmonary neg pulmonary ROS, neg sleep apnea, neg COPD,    breath sounds clear to auscultation- rhonchi (-) wheezing      Cardiovascular Exercise Tolerance: Good (-) hypertension(-) CAD, (-) Past MI, (-) Cardiac Stents and (-) CABG  Rhythm:Regular Rate:Normal - Systolic murmurs and - Diastolic murmurs    Neuro/Psych neg Seizures PSYCHIATRIC DISORDERS Schizophrenia negative neurological ROS     GI/Hepatic negative GI ROS, Neg liver ROS,   Endo/Other  negative endocrine ROSneg diabetes  Renal/GU negative Renal ROS     Musculoskeletal negative musculoskeletal ROS (+)   Abdominal (+) - obese,   Peds  Hematology negative hematology ROS (+)   Anesthesia Other Findings   Reproductive/Obstetrics                             Anesthesia Physical Anesthesia Plan  ASA: II  Anesthesia Plan: General   Post-op Pain Management:    Induction: Intravenous  PONV Risk Score and Plan: 1 and Ondansetron and Midazolam  Airway Management Planned: LMA  Additional Equipment:   Intra-op Plan:   Post-operative Plan:   Informed Consent: I have reviewed the patients History and Physical, chart, labs and discussed the procedure including the risks, benefits and alternatives for the proposed anesthesia with the patient or authorized representative who has indicated his/her understanding and acceptance.   Dental advisory given  Plan Discussed with: CRNA and Anesthesiologist  Anesthesia Plan Comments:         Anesthesia Quick Evaluation

## 2018-04-18 NOTE — Progress Notes (Signed)
[facility] - [place] [facility]   PATIENT NAME: Michael BlalockRobert [name]    MR#:  [id number]  DATE OF BIRTH:  03/29/1971  SUBJECTIVE:  CHIEF COMPLAINT:   Chief Complaint  Patient presents with  . Wound Infection   -Along the medial distal border of the right forearm, there is still significant induration and tenderness and decreased amount of purulence today. -Going for repeat I&D  REVIEW OF SYSTEMS:  Review of Systems  Constitutional: Negative for chills, fever and malaise/fatigue.  HENT: Negative for congestion, ear discharge, hearing loss and nosebleeds.   Eyes: Negative for blurred vision and double vision.  Respiratory: Negative for cough, shortness of breath and wheezing.   Cardiovascular: Negative for chest pain, palpitations and leg swelling.  Gastrointestinal: Negative for abdominal pain, constipation, diarrhea, nausea and vomiting.  Genitourinary: Negative for dysuria.  Musculoskeletal: Positive for joint pain and myalgias.  Neurological: Negative for dizziness, focal weakness, seizures, weakness and headaches.  Psychiatric/Behavioral: Negative for depression.    DRUG ALLERGIES:  No Known Allergies  VITALS:  Blood pressure 122/86, pulse 69, temperature 97.9 F (36.6 C), temperature source Oral, resp. rate 18, height 6\' 2"  (1.88 m), weight 84.5 kg, SpO2 99 %.  PHYSICAL EXAMINATION:  Physical Exam  GENERAL:  47 y.o.-year-old patient lying in the bed with no acute distress.  EYES: Pupils equal, round, reactive to light and accommodation. No scleral icterus. Extraocular muscles intact.  HEENT: Head atraumatic, normocephalic. Oropharynx and nasopharynx clear.  NECK:  Supple, no jugular venous distention. No thyroid enlargement, no tenderness.  LUNGS: Normal breath sounds bilaterally, no wheezing, rales,rhonchi or crepitation. No use of accessory muscles of respiration.  CARDIOVASCULAR: S1, S2 normal. No murmurs, rubs, or gallops.  ABDOMEN: Soft,  nontender, nondistended. Bowel sounds present. No organomegaly or mass.  EXTREMITIES: No pedal edema, cyanosis, or clubbing.  Right axillary lymph node is enlarged and tender.  Right forearm swelling is better, however indurated spot on the distal medial border .  With significant tenderness and indurated  NEUROLOGIC: Cranial nerves II through XII are intact. Muscle strength 5/5 in all extremities. Sensation intact. Gait not checked.  PSYCHIATRIC: The patient is alert and oriented x 3.  SKIN: No obvious rash, lesion, or ulcer.    LABORATORY PANEL:   CBC Recent Labs  Lab 04/17/18 0433  WBC 9.7  HGB 13.3  HCT 40.0  PLT 229   ------------------------------------------------------------------------------------------------------------------  Chemistries  Recent Labs  Lab 04/14/18 1551  04/18/18 0340  NA 136   < > 136  K 3.7   < > 4.0  CL 100   < > 103  CO2 28   < > 25  GLUCOSE 113*   < > 120*  BUN 12   < > 13  CREATININE 1.04   < > 0.86  CALCIUM 9.4   < > 8.9  AST 16  --   --   ALT 12  --   --   ALKPHOS 82  --   --   BILITOT 1.4*  --   --    < > = values in this interval not displayed.   ------------------------------------------------------------------------------------------------------------------  Cardiac Enzymes No results for input(s): TROPONINI in the last 168 hours. ------------------------------------------------------------------------------------------------------------------  RADIOLOGY:  No results found.  EKG:   Orders placed or performed during the hospital encounter of 01/30/18  . EKG 12-Lead  . EKG 12-Lead  . EKG    ASSESSMENT AND PLAN:   47 year old male with past medical history significant for  polysubstance abuse, history of admission for acute paranoia and psychosis in December 2019 presents to hospital secondary to right arm swelling after a bug bite.  1.  Right arm cellulitis-significant erythema still present.  X-rays ordered  did not  show any osteomyelitis or foreign body - not improving-significant induration status post I&D after adm, has another indurated spot distal to it which is not draining.  Repeat I&D today. -Appreciate surgical consult -Continue vancomycin and Rocephin-cultures growing gram-positive cocci.  Discontinue Rocephin  today -dressing changes daily -Keep the arm elevated.  Warm compresses  2.  Leukocytosis-secondary to above infection.  Improving.   -Continue to monitor  3.  DVT prophylaxis-Lovenox-   Independent and ambulatory. Anticipate discharge tomorrow   All the records are reviewed and case discussed with Care Management/Social Workerr. Management plans discussed with the patient, family and they are in agreement.  CODE STATUS: Full code  TOTAL TIME TAKING CARE OF THIS PATIENT: 31 minutes.   POSSIBLE D/C tomorrow, DEPENDING ON CLINICAL CONDITION.   Enid Baasadhika Juston Goheen M.D on 04/18/2018 at 9:17 AM  Between 7am to 6pm - Pager - [phone]  After 6pm go to [email or web address] - password [email or web address]  [facility] [place] [facility]  Office  [phone]  CC: Primary care physician; Patient, No Pcp Per

## 2018-04-18 NOTE — Anesthesia Postprocedure Evaluation (Signed)
Anesthesia Post Note  Patient: Lily LovingsRobert J Fellner  Procedure(s) Performed: IRRIGATION AND DEBRIDEMENT ABSCESS RIGHT ARM (Right Arm Lower)  Patient location during evaluation: PACU Anesthesia Type: General Level of consciousness: awake and alert and oriented Pain management: pain level controlled Vital Signs Assessment: post-procedure vital signs reviewed and stable Respiratory status: spontaneous breathing, nonlabored ventilation and respiratory function stable Cardiovascular status: blood pressure returned to baseline and stable Postop Assessment: no signs of nausea or vomiting Anesthetic complications: no     Last Vitals:  Vitals:   04/18/18 1300 04/18/18 1322  BP: 126/84 (!) 139/95  Pulse: 70 76  Resp: 16 17  Temp: 36.9 C 36.5 C  SpO2: 98% 98%    Last Pain:  Vitals:   04/18/18 1420  TempSrc:   PainSc: 9                  Bralyn Folkert

## 2018-04-19 ENCOUNTER — Encounter: Payer: Self-pay | Admitting: Surgery

## 2018-04-19 LAB — AEROBIC CULTURE W GRAM STAIN (SUPERFICIAL SPECIMEN)

## 2018-04-19 LAB — CULTURE, BLOOD (SINGLE)
Culture: NO GROWTH
Culture: NO GROWTH
SPECIAL REQUESTS: ADEQUATE
Special Requests: ADEQUATE

## 2018-04-19 MED ORDER — OXYCODONE HCL 5 MG PO TABS: 5.0000 mg | ORAL_TABLET | Freq: Four times a day (QID) | ORAL | 0 refills | Status: DC | PRN

## 2018-04-19 MED ORDER — SULFAMETHOXAZOLE-TRIMETHOPRIM 800-160 MG PO TABS: 1.0000 | ORAL_TABLET | Freq: Two times a day (BID) | ORAL | 0 refills | Status: DC

## 2018-04-19 NOTE — Discharge Summary (Signed)
[facility] [place] [facility]   PATIENT NAME: Michael BlalockRobert [name]    MR#:  [id number]  DATE OF BIRTH:  01/07/1971  DATE OF ADMISSION:  04/14/2018   ADMITTING PHYSICIAN: Oralia Manisavid Willis, MD  DATE OF DISCHARGE:  04/19/18  PRIMARY CARE PHYSICIAN: Patient, No Pcp Per   ADMISSION DIAGNOSIS:   Cellulitis of right upper extremity [L03.113]  DISCHARGE DIAGNOSIS:   Principal Problem:   Cellulitis and abscess of upper extremity   SECONDARY DIAGNOSIS:   Past Medical History:  Diagnosis Date  . Patient denies medical problems     HOSPITAL COURSE:   47 year old male with past medical history significant for polysubstance abuse, history of admission for acute paranoia and psychosis in December 2019 presents to hospital secondary to right arm swelling after a bug bite.  1.  Right arm cellulitis-significant erythema still present.  X-rays ordered  did not show any osteomyelitis or foreign body -Did not improve with IV antibiotics initially, patient had IND done twice on this forearm with significant improvement.  Still has open incision with drains.  Recommend dressing changes as advised by surgery daily with dry gauze and tape.  Penrose drains will stay in place and follow-up with surgery in 1 week -Wound cultures growing Staphylococcus.  Was on vancomycin here.  Discharged on Bactrim -Appreciate surgical consult  2.  Leukocytosis-secondary to above infection.  Improved.   -Continue to monitor  3.  DVT prophylaxis-Lovenox-   Independent and ambulatory. Discharge today  DISCHARGE CONDITIONS:   Guarded  CONSULTS OBTAINED:   Treatment Team:  Henrene DodgePiscoya, Jose, MD  DRUG ALLERGIES:   No Known Allergies DISCHARGE MEDICATIONS:   Allergies as of 04/19/2018   No Known Allergies     Medication List    STOP taking these medications   OLANZapine 10 MG tablet Commonly known as:  ZYPREXA   predniSONE 20 MG tablet Commonly known as:  DELTASONE     TAKE these medications   oxyCODONE 5 MG immediate release tablet Commonly known as:  Oxy IR/ROXICODONE Take 1 tablet (5 mg total) by mouth every 6 (six) hours as needed for moderate pain or severe pain.   sulfamethoxazole-trimethoprim 800-160 MG tablet Commonly known as:  BACTRIM DS,SEPTRA DS Take 1 tablet by mouth 2 (two) times daily for 10 days.        DISCHARGE INSTRUCTIONS:   1. Surgery f/u in 1-2 weeks  DIET:   Regular diet  ACTIVITY:   Activity as tolerated  OXYGEN:   Home Oxygen: No.  Oxygen Delivery: room air  DISCHARGE LOCATION:   home   If you experience worsening of your admission symptoms, develop shortness of breath, life threatening emergency, suicidal or homicidal thoughts you must seek medical attention immediately by calling 911 or calling your MD immediately  if symptoms less severe.  You Must read complete instructions/literature along with all the possible adverse reactions/side effects for all the Medicines you take and that have been prescribed to you. Take any new Medicines after you have completely understood and accpet all the possible adverse reactions/side effects.   Please note  You were cared for by a hospitalist during your hospital stay. If you have any questions about your discharge medications or the care you received while you were in the hospital after you are discharged, you can call the unit and asked to speak with the hospitalist on call if the hospitalist that took care of you is not available. Once you are discharged, your primary care physician  will handle any further medical issues. Please note that NO REFILLS for any discharge medications will be authorized once you are discharged, as it is imperative that you return to your primary care physician (or establish a relationship with a primary care physician if you do not have one) for your aftercare needs so that they can reassess your need for medications and monitor your lab  values.    On the day of Discharge:  VITAL SIGNS:   Blood pressure 102/64, pulse 70, temperature 97.6 F (36.4 C), temperature source Oral, resp. rate 18, height 6\' 2"  (1.88 m), weight 84.5 kg, SpO2 100 %.  PHYSICAL EXAMINATION:   GENERAL:  47 y.o.-year-old patient lying in the bed with no acute distress.  EYES: Pupils equal, round, reactive to light and accommodation. No scleral icterus. Extraocular muscles intact.  HEENT: Head atraumatic, normocephalic. Oropharynx and nasopharynx clear.  NECK:  Supple, no jugular venous distention. No thyroid enlargement, no tenderness.  LUNGS: Normal breath sounds bilaterally, no wheezing, rales,rhonchi or crepitation. No use of accessory muscles of respiration.  CARDIOVASCULAR: S1, S2 normal. No murmurs, rubs, or gallops.  ABDOMEN: Soft, nontender, nondistended. Bowel sounds present. No organomegaly or mass.  EXTREMITIES: No pedal edema, cyanosis, or clubbing.  Much improved right forearm swelling, left big incision with drains in place. NEUROLOGIC: Cranial nerves II through XII are intact. Muscle strength 5/5 in all extremities. Sensation intact. Gait not checked.  PSYCHIATRIC: The patient is alert and oriented x 3.  SKIN: No obvious rash, lesion, or ulcer.   DATA REVIEW:   CBC Recent Labs  Lab 04/17/18 0433  WBC 9.7  HGB 13.3  HCT 40.0  PLT 229    Chemistries  Recent Labs  Lab 04/14/18 1551  04/18/18 0340  NA 136   < > 136  K 3.7   < > 4.0  CL 100   < > 103  CO2 28   < > 25  GLUCOSE 113*   < > 120*  BUN 12   < > 13  CREATININE 1.04   < > 0.86  CALCIUM 9.4   < > 8.9  AST 16  --   --   ALT 12  --   --   ALKPHOS 82  --   --   BILITOT 1.4*  --   --    < > = values in this interval not displayed.     Microbiology Results  Results for orders placed or performed during the hospital encounter of 04/14/18  Blood culture (single)     Status: None   Collection Time: 04/14/18  3:53 PM  Result Value Ref Range Status   Specimen  Description BLOOD LEFT ANTECUBITAL  Final   Special Requests   Final    BOTTLES DRAWN AEROBIC AND ANAEROBIC Blood Culture adequate volume   Culture   Final    NO GROWTH 5 DAYS Performed at [facility], [address], [place], [place] [address]    Report Status 04/19/2018 FINAL  Final  Blood culture (single)     Status: None   Collection Time: 04/14/18  3:58 PM  Result Value Ref Range Status   Specimen Description BLOOD BLOOD LEFT HAND  Final   Special Requests   Final    BOTTLES DRAWN AEROBIC AND ANAEROBIC Blood Culture adequate volume   Culture   Final    NO GROWTH 5 DAYS Performed at [facility], [address], [place], [place] [address]    Report  Status 04/19/2018 FINAL  Final  Aerobic Culture (superficial specimen)     Status: None (Preliminary result)   Collection Time: 04/16/18  3:52 PM  Result Value Ref Range Status   Specimen Description   Final    ABSCESS RIGHT FOREARM DISTAL Performed at [facility], [address] [place], [place] [address]    Special Requests   Final    NONE Performed at [facility], [address] [place], [place] [address]    Gram Stain   Final    ABUNDANT WBC PRESENT, PREDOMINANTLY PMN RARE GRAM POSITIVE COCCI    Culture MODERATE STAPHYLOCOCCUS AUREUS  Final   Report Status PENDING  Incomplete  Anaerobic culture     Status: None (Preliminary result)   Collection Time: 04/17/18  6:41 AM  Result Value Ref Range Status   Specimen Description   Final    ABSCESS RIGHT FOREARM DISTAL Performed at [facility], [address] [place], [place] [address]    Special Requests   Final    NONE Performed at [facility], [address] [place], [place] [address]    Gram Stain   Final    RARE WBC PRESENT, PREDOMINANTLY PMN RARE GRAM POSITIVE COCCI Performed at [facility], [address] [place], [place] [address]    Culture PENDING  Incomplete   Report Status PENDING   Incomplete    RADIOLOGY:  No results found.   Management plans discussed with the patient, family and they are in agreement.  CODE STATUS:     Code Status Orders  (From admission, onward)         Start     Ordered   04/14/18 2244  Full code  Continuous     04/14/18 2243        Code Status History    Date Active Date Inactive Code Status Order ID Comments User Context   01/31/2018 2138 02/02/2018 1537 Full Code [id number]  Audery Amellapacs, John T, MD Inpatient   07/27/2017 0226 07/28/2017 1931 Full Code [id number]  Oralia ManisWillis, David, MD ED      TOTAL TIME TAKING CARE OF THIS PATIENT: 38 minutes.    Enid Baasadhika Tacia Hindley M.D on 04/19/2018 at 9:40 AM  Between 7am to 6pm - Pager - [phone]  After 6pm go to [email or web address] - Social research officer, governmentpassword [email or web address] [email or web address]  [facility] [place] [facility]  Office  [phone]  CC: Primary care physician; Patient, No Pcp Per   Note: This dictation was prepared with Dragon dictation along with smaller phrase technology. Any transcriptional errors that result from this process are unintentional.

## 2018-04-19 NOTE — Care Management (Signed)
Spoke with Patient and he stated that Medicaid has assigned him a PCP, he was not sure of their name.  I provided him a sliding scale clinic list,  I explained that he would need to follow up with a PCP as well as the specialist after Discharge.  He stated understanding.

## 2018-04-19 NOTE — Care Management (Addendum)
Patient discharging home today on PO antibiotics. No RNCM needs identified. No PCP listed. List of sliding scale clinics provided to discharging RN. Per note, patient to schedule in 1 week follow up with surgeon.

## 2018-04-19 NOTE — Progress Notes (Signed)
[name] SURGICAL PROGRESS NOTE  Hospital Day(s): 5.   Post op day(s): 1 Day Post-Op.   Interval History: Patient seen and examined, no acute events or new complaints overnight. Patient reports that his pain has significantly improved and the swelling and redness have also gone down. No complaints of fever or chills. Tolerating diet.   Review of Systems:  Constitutional: denies fever, chills  Gastrointestinal: denies N/V, or diarrhea/and bowel function as per interval history Integumentary: denies any other rashes or skin discolorations except right upper extremity abscess  Vital signs in last 24 hours: [min-max] current  Temp:  [97.6 F (36.4 C)-98.5 F (36.9 C)] 97.6 F (36.4 C) (12/31 0742) Pulse Rate:  [70-94] 70 (12/31 0742) Resp:  [13-19] 18 (12/30 2336) BP: (102-143)/(64-95) 102/64 (12/31 0742) SpO2:  [96 %-100 %] 100 % (12/31 0742)     Height: 6\' 2"  (188 cm) Weight: 84.5 kg BMI (Calculated): 23.91   Intake/Output this shift:  No intake/output data recorded.   Intake/Output last 2 shifts:  @IOLAST2SHIFTS @   Physical Exam:  Constitutional: alert, cooperative and no distress  Respiratory: breathing non-labored at rest  Integumentary: right lateral wrist erythema and swelling significantly improved, two penrose drains in 3 incision sites, no purulent drainage present, dressing changed   Labs:  CBC Latest Ref Rng & Units 04/17/2018 04/16/2018 04/15/2018  WBC 4.0 - 10.5 K/uL 9.7 11.3(H) 13.8(H)  Hemoglobin 13.0 - 17.0 g/dL 44.013.3 12.9(L) 12.4(L)  Hematocrit 39.0 - 52.0 % 40.0 38.5(L) 37.1(L)  Platelets 150 - 400 K/uL 229 207 193   CMP Latest Ref Rng & Units 04/18/2018 04/16/2018 04/15/2018  Glucose 70 - 99 mg/dL 102(V120(H) 253(G120(H) 644(I135(H)  BUN 6 - 20 mg/dL 13 7 10   Creatinine 0.61 - 1.24 mg/dL 3.470.86 4.250.79 9.560.83  Sodium 135 - 145 mmol/L 136 135 137  Potassium 3.5 - 5.1 mmol/L 4.0 3.7 4.0  Chloride 98 - 111 mmol/L 103 103 105  CO2 22 - 32 mmol/L 25 24 25    Calcium 8.9 - 10.3 mg/dL 8.9 3.8(V8.6(L) 5.6(E8.5(L)  Total Protein 6.5 - 8.1 g/dL - - -  Total Bilirubin 0.3 - 1.2 mg/dL - - -  Alkaline Phos 38 - 126 U/L - - -  AST 15 - 41 U/L - - -  ALT 0 - 44 U/L - - -      Assessment/Plan: (ICD-10's: L02.413) 47 y.o. male with improved erythema and swelling to the right wrist who is 1 Day Post-Op s/p incision and drainage for right wrist abscess   - Recommend 7 days of Bactrim for outpatient ABx therapy   - Daily dressing changes with 4x4 gauze and tape  - Penrose drains will stay in place  - Follow up with Dr Aleen CampiPiscoya in 1 week    All of the above findings and recommendations were discussed with the patient, and the medical team, and all of patient's questions were answered to his expressed satisfaction.  -- Lynden OxfordZachary Schulz, PA-C [facility] 04/19/2018, 9:27 AM [phone] M-F: 7am - 4pm

## 2018-04-22 LAB — ANAEROBIC CULTURE

## 2018-04-26 ENCOUNTER — Other Ambulatory Visit: Payer: Self-pay

## 2018-04-26 ENCOUNTER — Ambulatory Visit (INDEPENDENT_AMBULATORY_CARE_PROVIDER_SITE_OTHER): Payer: Medicaid Other | Admitting: Surgery

## 2018-04-26 ENCOUNTER — Encounter: Payer: Self-pay | Admitting: Surgery

## 2018-04-26 VITALS — BP 111/72 | HR 88 | Temp 97.5°F | Resp 18 | Ht 74.0 in | Wt 188.0 lb

## 2018-04-26 DIAGNOSIS — Z09 Encounter for follow-up examination after completed treatment for conditions other than malignant neoplasm: Secondary | ICD-10-CM

## 2018-04-26 DIAGNOSIS — L03119 Cellulitis of unspecified part of limb: Secondary | ICD-10-CM

## 2018-04-26 DIAGNOSIS — L02419 Cutaneous abscess of limb, unspecified: Secondary | ICD-10-CM

## 2018-04-26 NOTE — Patient Instructions (Signed)
Remove dressing from wound and clean wound with soap and water.   Please pick up your Antibiotic today and start taking it.   Please see your follow up appointment listed below.

## 2018-04-26 NOTE — Progress Notes (Signed)
04/26/2018  HPI: Michael [name] is a 48 y.o. male s/p I&D x 2 of right distal arm abscess.  Two penrose drains were left in place.  He was discharged and never picked up his Bactrim prescription.  Denies any worsening pain, swelling, or purulent drainage.  Vital signs: BP 111/72   Pulse 88   Temp (!) 97.5 F (36.4 C) (Oral)   Resp 18   Ht 6\' 2"  (1.88 m)   Wt 188 lb (85.3 kg)   SpO2 97%   BMI 24.14 kg/m    Physical Exam: Constitutional: No acute distress Skin;  Right distal arm I&D sites with no purulence and with mild erythema only surrounding the drain sites.  Drains removed without complications and dressed with gauze/tape.  Assessment/Plan: This is a 48 y.o. male s/p I&D x 2 of right distal arm abscess.  --drains removed without issues.  Instructed to change dressing daily and as needed with gauze and tape. --instructed the patient to go to his pharmacy to pick up the prescription for Bactrim given.  He should take this until course completed. --follow up in two weeks for wound check.   Howie Ill, MD  [facility]

## 2018-05-11 ENCOUNTER — Ambulatory Visit: Payer: Self-pay | Admitting: Surgery

## 2018-05-17 ENCOUNTER — Encounter: Payer: Self-pay | Admitting: Surgery

## 2018-05-17 ENCOUNTER — Ambulatory Visit (INDEPENDENT_AMBULATORY_CARE_PROVIDER_SITE_OTHER): Payer: Medicaid Other | Admitting: Surgery

## 2018-05-17 ENCOUNTER — Other Ambulatory Visit: Payer: Self-pay

## 2018-05-17 VITALS — BP 137/91 | HR 74 | Temp 96.1°F | Resp 16 | Ht 74.5 in | Wt 192.0 lb

## 2018-05-17 DIAGNOSIS — Z09 Encounter for follow-up examination after completed treatment for conditions other than malignant neoplasm: Secondary | ICD-10-CM | POA: Diagnosis not present

## 2018-05-17 DIAGNOSIS — L03119 Cellulitis of unspecified part of limb: Secondary | ICD-10-CM

## 2018-05-17 DIAGNOSIS — L02419 Cutaneous abscess of limb, unspecified: Secondary | ICD-10-CM | POA: Diagnosis not present

## 2018-05-17 MED ORDER — SULFAMETHOXAZOLE-TRIMETHOPRIM 800-160 MG PO TABS: 1.0000 | ORAL_TABLET | Freq: Two times a day (BID) | ORAL | 0 refills | Status: DC

## 2018-05-17 NOTE — Patient Instructions (Signed)
Please pick up your medicine at the pharmacy today and begin taking it.  Please call our office if you have questions or concerns.

## 2018-05-17 NOTE — Progress Notes (Signed)
05/17/2018  HPI: Michael [name] is a 48 y.o. male s/p right distal forearm I&D for an abscess on 04/18/18. He presents today for follow up.  He missed his appointment last week.  He reports today that he was blowing leaves again and had a scratch in the same area of the forearm, and he thought it was starting to get red.  He denies any worsening pain or swelling in that area.  Denies any fevers or chills.  Vital signs: BP (!) 137/91   Pulse 74   Temp (!) 96.1 F (35.6 C) (Oral)   Resp 16   Ht 6' 2.5" (1.892 m)   Wt 192 lb (87.1 kg)   SpO2 99%   BMI 24.32 kg/m    Physical Exam: Constitutional: No acute distress Extremity:  Right distal forearm I&D sites are healing well.  There is mild erythema at the previous site of abscess, but no induration or any fluctuance.  No tenderness to palpation  Assessment/Plan: This is a 48 y.o. male s/p right distal forearm I&D.  --As a precaution, will give the patient a new prescription for Bactrim DS 1 week course.  His previous culture from I&D grew MRSA and was sensitive to Bactrim. --Patient knows to call us if the area is getting worse despite of antibiotics.  Otherwise he may follow up as needed.   Howie Ill, MD [facility]

## 2018-12-21 ENCOUNTER — Other Ambulatory Visit: Payer: Self-pay

## 2018-12-21 DIAGNOSIS — Z20822 Contact with and (suspected) exposure to covid-19: Secondary | ICD-10-CM

## 2018-12-22 LAB — NOVEL CORONAVIRUS, NAA: SARS-CoV-2, NAA: NOT DETECTED

## 2019-03-21 DIAGNOSIS — F4323 Adjustment disorder with mixed anxiety and depressed mood: Secondary | ICD-10-CM | POA: Diagnosis not present

## 2019-03-22 DIAGNOSIS — F4323 Adjustment disorder with mixed anxiety and depressed mood: Secondary | ICD-10-CM | POA: Diagnosis not present

## 2019-03-28 DIAGNOSIS — F4323 Adjustment disorder with mixed anxiety and depressed mood: Secondary | ICD-10-CM | POA: Diagnosis not present

## 2019-04-04 DIAGNOSIS — F4323 Adjustment disorder with mixed anxiety and depressed mood: Secondary | ICD-10-CM | POA: Diagnosis not present

## 2019-04-05 DIAGNOSIS — F4323 Adjustment disorder with mixed anxiety and depressed mood: Secondary | ICD-10-CM | POA: Diagnosis not present

## 2019-05-09 DIAGNOSIS — F4323 Adjustment disorder with mixed anxiety and depressed mood: Secondary | ICD-10-CM | POA: Diagnosis not present

## 2019-05-12 DIAGNOSIS — F4323 Adjustment disorder with mixed anxiety and depressed mood: Secondary | ICD-10-CM | POA: Diagnosis not present

## 2019-05-22 IMAGING — DX DG HAND COMPLETE 3+V*R*
3 series · 3 of 3 positions shown · non-contrast
Comparison: 12/04/2004

CLINICAL DATA: Acute RIGHT hand swelling/cellulitis.

EXAM:
RIGHT HAND - COMPLETE 3+ VIEW

[hand ap]
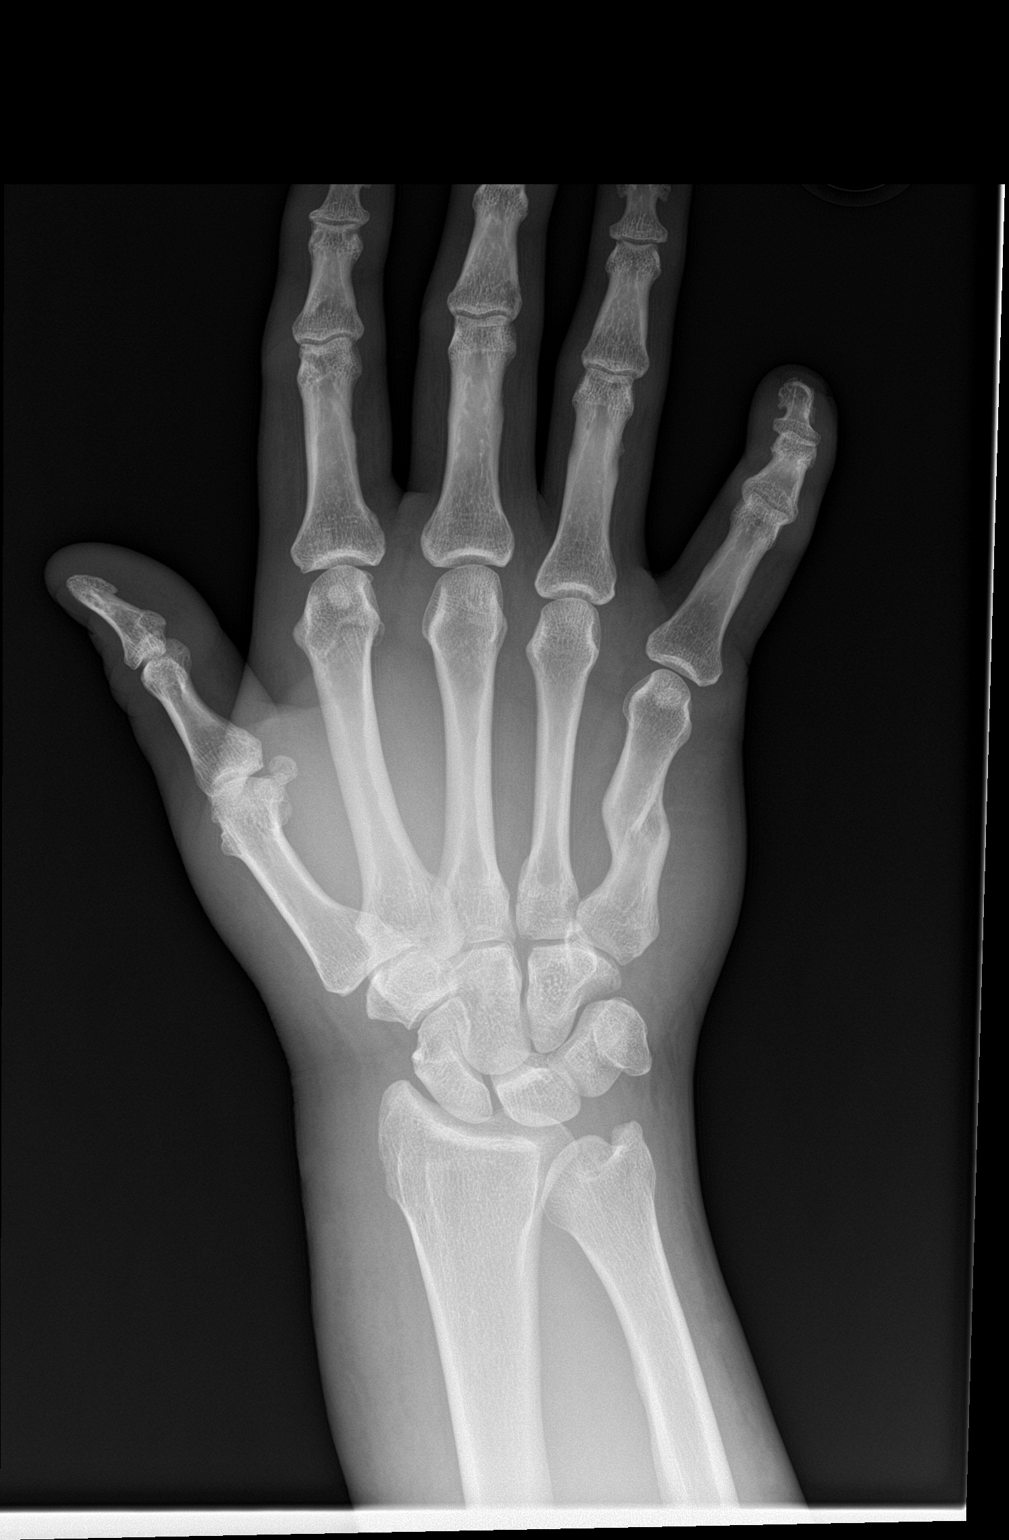

[hand obl]
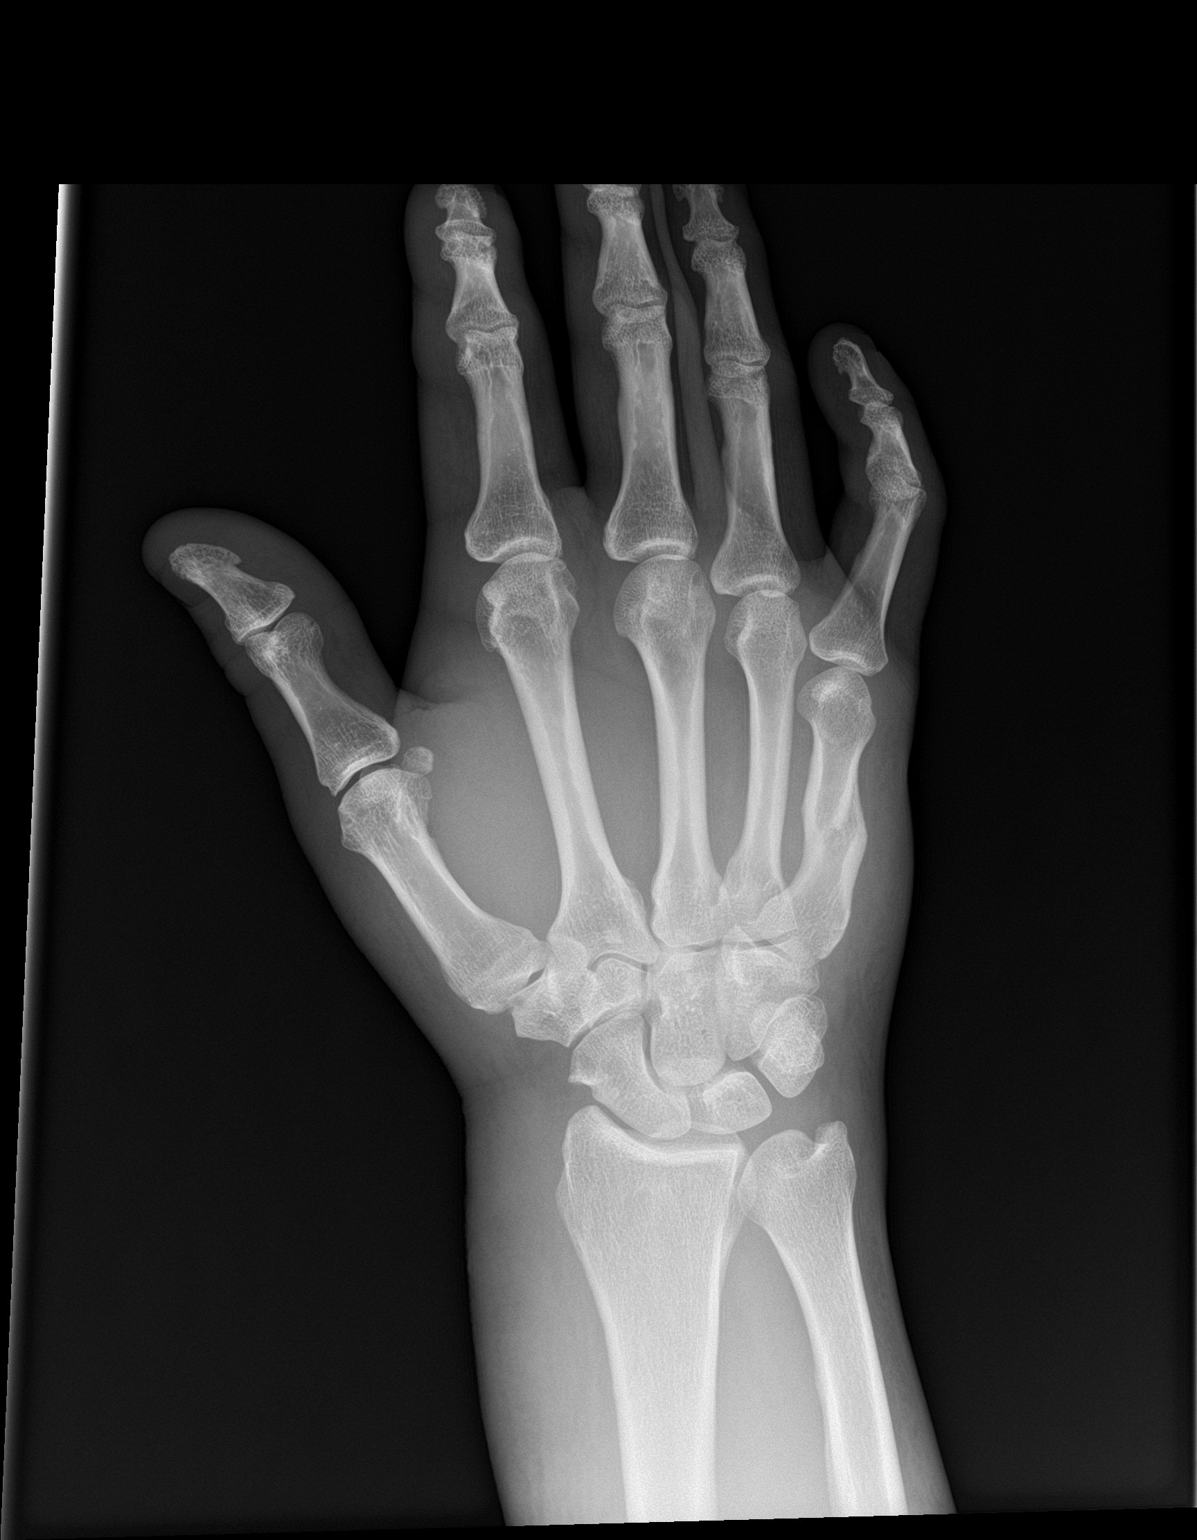

[hand lat]
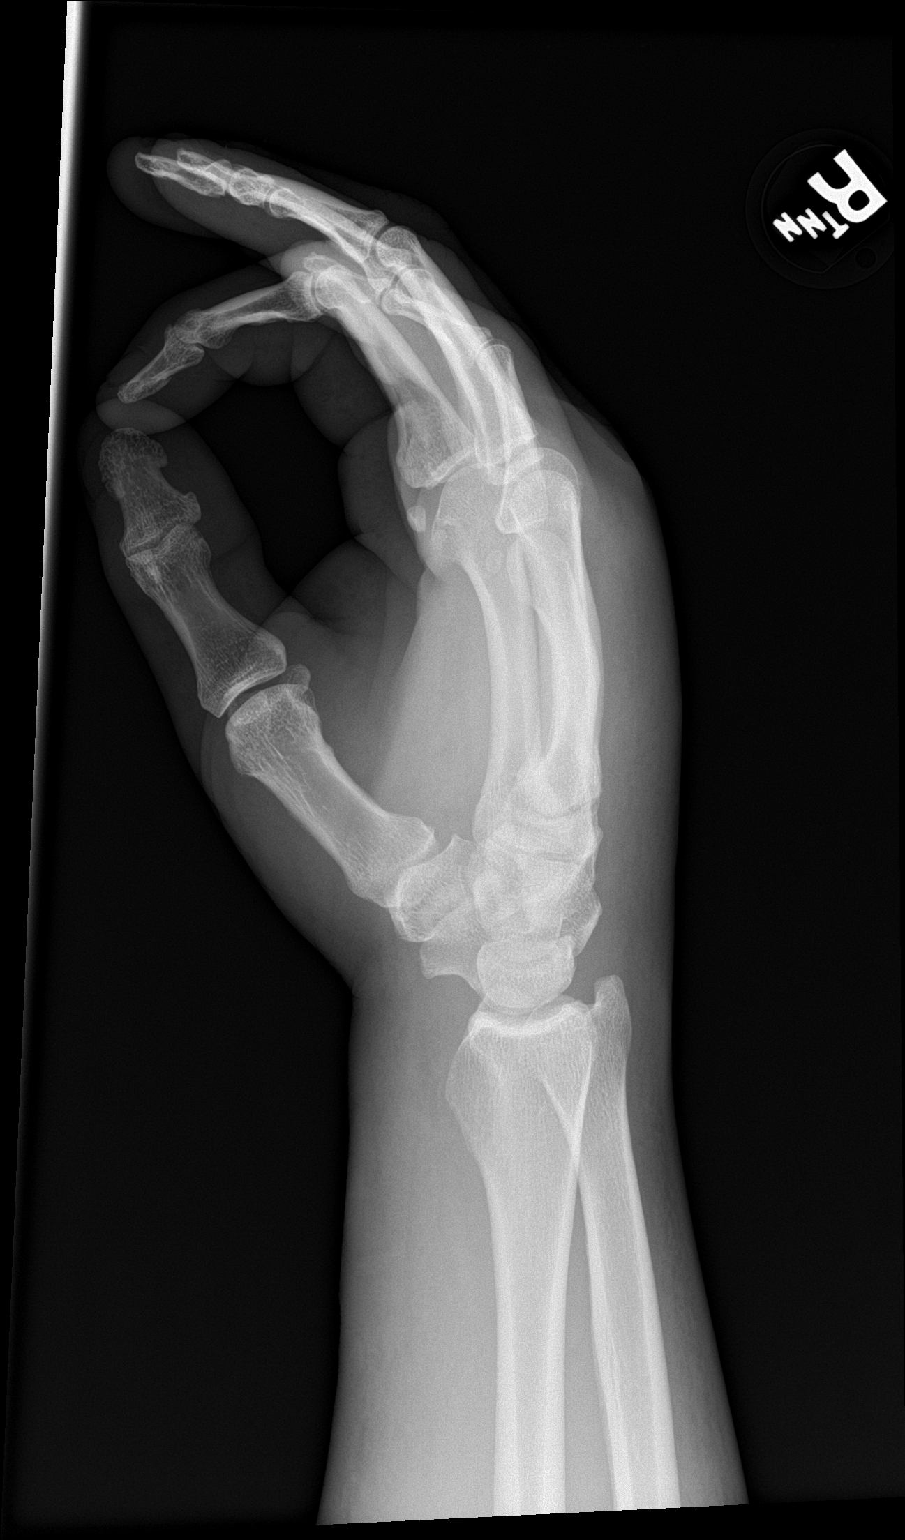

[3 of 3 positions shown; findings below may reference images not displayed]

FINDINGS: No acute fracture, subluxation or dislocation.

No radiographic evidence of osteomyelitis noted.

Diffuse soft tissue swelling is present.

No radiopaque foreign bodies or soft tissue gas identified.

A remote 5th metacarpal fracture is present.
IMPRESSION: Diffuse soft tissue swelling without acute bony abnormality.

## 2019-05-30 DIAGNOSIS — F4323 Adjustment disorder with mixed anxiety and depressed mood: Secondary | ICD-10-CM | POA: Diagnosis not present

## 2019-06-06 DIAGNOSIS — F4323 Adjustment disorder with mixed anxiety and depressed mood: Secondary | ICD-10-CM | POA: Diagnosis not present

## 2019-06-13 DIAGNOSIS — F4323 Adjustment disorder with mixed anxiety and depressed mood: Secondary | ICD-10-CM | POA: Diagnosis not present

## 2019-06-20 DIAGNOSIS — F4323 Adjustment disorder with mixed anxiety and depressed mood: Secondary | ICD-10-CM | POA: Diagnosis not present

## 2019-06-21 DIAGNOSIS — F4323 Adjustment disorder with mixed anxiety and depressed mood: Secondary | ICD-10-CM | POA: Diagnosis not present

## 2019-06-21 DIAGNOSIS — Z79899 Other long term (current) drug therapy: Secondary | ICD-10-CM | POA: Diagnosis not present

## 2019-06-27 DIAGNOSIS — F4323 Adjustment disorder with mixed anxiety and depressed mood: Secondary | ICD-10-CM | POA: Diagnosis not present

## 2019-07-11 DIAGNOSIS — F4323 Adjustment disorder with mixed anxiety and depressed mood: Secondary | ICD-10-CM | POA: Diagnosis not present

## 2019-07-18 DIAGNOSIS — F4323 Adjustment disorder with mixed anxiety and depressed mood: Secondary | ICD-10-CM | POA: Diagnosis not present

## 2019-07-25 DIAGNOSIS — F4323 Adjustment disorder with mixed anxiety and depressed mood: Secondary | ICD-10-CM | POA: Diagnosis not present

## 2019-08-01 DIAGNOSIS — F4323 Adjustment disorder with mixed anxiety and depressed mood: Secondary | ICD-10-CM | POA: Diagnosis not present

## 2019-08-15 DIAGNOSIS — F4323 Adjustment disorder with mixed anxiety and depressed mood: Secondary | ICD-10-CM | POA: Diagnosis not present

## 2019-08-22 DIAGNOSIS — F4323 Adjustment disorder with mixed anxiety and depressed mood: Secondary | ICD-10-CM | POA: Diagnosis not present

## 2019-08-29 DIAGNOSIS — F4323 Adjustment disorder with mixed anxiety and depressed mood: Secondary | ICD-10-CM | POA: Diagnosis not present

## 2019-09-05 DIAGNOSIS — F4323 Adjustment disorder with mixed anxiety and depressed mood: Secondary | ICD-10-CM | POA: Diagnosis not present

## 2019-09-12 DIAGNOSIS — F4323 Adjustment disorder with mixed anxiety and depressed mood: Secondary | ICD-10-CM | POA: Diagnosis not present

## 2019-09-14 DIAGNOSIS — F329 Major depressive disorder, single episode, unspecified: Secondary | ICD-10-CM | POA: Diagnosis not present

## 2019-10-10 DIAGNOSIS — F4323 Adjustment disorder with mixed anxiety and depressed mood: Secondary | ICD-10-CM | POA: Diagnosis not present

## 2019-10-17 DIAGNOSIS — F4323 Adjustment disorder with mixed anxiety and depressed mood: Secondary | ICD-10-CM | POA: Diagnosis not present

## 2020-08-19 ENCOUNTER — Ambulatory Visit: Payer: Medicaid Other | Admitting: Family Medicine

## 2020-08-19 ENCOUNTER — Encounter: Payer: Self-pay | Admitting: Family Medicine

## 2020-08-19 ENCOUNTER — Other Ambulatory Visit: Payer: Self-pay

## 2020-08-19 VITALS — BP 124/86 | HR 91 | Ht 74.5 in | Wt 202.2 lb

## 2020-08-19 DIAGNOSIS — Z7689 Persons encountering health services in other specified circumstances: Secondary | ICD-10-CM | POA: Diagnosis not present

## 2020-08-19 DIAGNOSIS — M19041 Primary osteoarthritis, right hand: Secondary | ICD-10-CM | POA: Diagnosis not present

## 2020-08-19 DIAGNOSIS — M19042 Primary osteoarthritis, left hand: Secondary | ICD-10-CM

## 2020-08-19 DIAGNOSIS — M720 Palmar fascial fibromatosis [Dupuytren]: Secondary | ICD-10-CM | POA: Diagnosis not present

## 2020-08-19 NOTE — Patient Instructions (Addendum)
Thank you for coming to the office today.  Dupuytren's Contracture  They will call you with an apt. Stay tuned.  [facility]. Everlene Other, MD Address: [address], [place], [place] [address] Phone: [phone]  ---------------------  Use topical Voltaren (Diclofenac) use up to 1 to 4 times a day as needed on each hand. Can help pain and swelling.   FOR NEXT APT - DUE for FASTING BLOOD WORK (no food or drink after midnight before the lab appointment, only water or coffee without cream/sugar on the morning of)   Please schedule a Follow-up Appointment to: Return in about 4 months (around 12/20/2020) for 4 month follow-up AM apt for Annual Physical and fasting lab AFTER visit.  If you have any other questions or concerns, please feel free to call the office or send a message through MyChart. You may also schedule an earlier appointment if necessary.  Additionally, you may be receiving a survey about your experience at our office within a few days to 1 week by e-mail or mail. We value your feedback.  Saralyn Pilar, DO [facility]   Dupuytren's Contracture Dupuytren's contracture is a condition in which tissue under the skin of the palm becomes thick. This causes one or more of the fingers to curl inward (contract) toward the palm. After a while, the fingers may not be able to straighten out. This condition affects some or all of the fingers and the palm of the hand. This condition may affect one or both hands. Dupuytren's contracture is a long-term (chronic) condition that develops (progresses) slowly over time. There is no cure, but symptoms can be managed and progression can be slowed with treatment. This condition is usually not dangerous or painful, but it can interfere with everyday tasks. What are the causes? This condition is caused by tissue (fascia) in the palm that gets thicker and tighter. When the fascia  thickens, it pulls on the cords of tissue (tendons) that control finger movement. This causes the fingers to contract. The cause of fascia thickening is not known. However, the condition is often passed along from parent to child (inherited).   What increases the risk? The following factors may make you more likely to develop this condition:  Being 72 years of age or older.  Being male.  Having a family history of this condition.  Using tobacco products, including cigarettes, chewing tobacco, and e-cigarettes.  Drinking alcohol excessively.  Having diabetes.  Having a seizure disorder. What are the signs or symptoms? Early symptoms of this condition may include:  Thick, puckered skin on the hand.  One or more lumps (nodules) on the palm. Nodules may be tender when they first appear, but they are generally painless. Later symptoms of this condition may include:  Thick cords of tissue in the palm.  Fingers curled up toward the palm.  Inability to straighten the fingers into their normal position. Though this condition is usually painless, you may have discomfort when holding or grabbing objects.   How is this diagnosed? This condition is diagnosed with a physical exam, which may include:  Looking at your hands and feeling your palms. This is to check for thickened fascia and nodules.  Measuring finger motion.  Doing the [place] tabletop test. You may be asked to try to put your hand on a surface, with your palm down and your fingers straight out. How is this treated? There is no cure for this condition,  but treatment can relieve discomfort and make symptoms more manageable. Treatment options may include:  Physical therapy. This can strengthen your hand and increase flexibility.  Occupational therapy. This can help you with everyday tasks that may be more difficult because of your condition.  Shots (injections). Substances may be injected into your hand, such  as: ? Medicines that help to decrease swelling (corticosteroids). ? Proteins (collagenase) to weaken thick tissue. After a collagenase injection, your health care provider may stretch your fingers.  Needle aponeurotomy. A needle is pushed through the skin and into the fascia. Moving the needle against the fascia can weaken or break up the thick tissue.  Surgery. This may be needed if your condition causes discomfort or interferes with everyday activities. Physical therapy is usually needed after surgery. No treatment is guaranteed to cure this condition. Recurrence of symptoms is common. Follow these instructions at home: Hand care  Take these actions to help protect your hand from possible injury: ? Use tools that have padded grips. ? Wear protective gloves while you work with your hands. ? Avoid repetitive hand movements. General instructions  Take over-the-counter and prescription medicines only as told by your health care provider.  Manage any other conditions that you have, such as diabetes.  If physical therapy was prescribed, do exercises as told by your health care provider.  Do not use any products that contain nicotine or tobacco, such as cigarettes, e-cigarettes, and chewing tobacco. If you need help quitting, ask your health care provider.  If you drink alcohol: ? Limit how much you use to:  0-1 drink a day for women.  0-2 drinks a day for men. ? Be aware of how much alcohol is in your drink. In the [place] one drink equals one 12 oz bottle of beer (355 mL), one 5 oz glass of wine (148 mL), or one 1 oz glass of hard liquor (44 mL).  Keep all follow-up visits as told by your health care provider. This is important. Contact a health care provider if:  You develop new symptoms, or your symptoms get worse.  You have pain that gets worse or does not get better with medicine.  You have difficulty or discomfort with everyday tasks.  You develop numbness or tingling. Get  help right away if:  You have severe pain.  Your fingers change color or become unusually cold. Summary  Dupuytren's contracture is a condition in which tissue under the skin of the palm becomes thick.  This condition is caused by tissue (fascia) that thickens. When it thickens, it pulls on the cords of tissue (tendons) that control finger movement and makes the fingers to contract.  You are more likely to develop this condition if you are a man, are over 56 years of age, have a family history of the condition, and drink a lot of alcohol.  This condition can be treated with physical and occupational therapy, injections, and surgery.  Follow instructions about how to care for your hand. Get help right away if you have severe pain or your fingers change color or become cold. This information is not intended to replace advice given to you by your health care provider. Make sure you discuss any questions you have with your health care provider. Document Revised: 10/26/2017 Document Reviewed: 10/26/2017 Elsevier Patient Education  2021 [employer].

## 2020-08-19 NOTE — Progress Notes (Signed)
Subjective:    Patient ID: Michael [name], male    DOB: Apr 28, 1970, 50 y.o.   MRN: [id number]  Michael [name] is a 50 y.o. 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 He also reports problem with a scarred tendon of his fingers on both hands, ring finger on each side he has a difficult time stretching out his finger and it seems to have scarred down. He has difficulty with grip and lifting objects and causing pain in his hands. - has not seen orthopedics or had treatment on this.  History of Right Hand/wrist Abscess following bite Reports prior history of complication back in 03/2018 and 2020 early with a spider/insect or snake bite on R hand/wrist, caused cellulitis and infection abscess, required surgical I&D drainage by Dr Aleen Campi [facility]. Resolved problem.  Alkaline diet for past 2 month He has done well, has not been as aggressive with exercise regimen lately, some weight gain again recently.  Health Maintenance: Future colon / prostate cancer screening in future.  No flowsheet data found.  Past Medical History:  Diagnosis Date  . Patient denies medical problems    Past Surgical History:  Procedure Laterality Date  . IRRIGATION AND DEBRIDEMENT ABSCESS Right 04/18/2018   Procedure: IRRIGATION AND DEBRIDEMENT ABSCESS RIGHT ARM;  Surgeon: Henrene Dodge, MD;  Location: [facility] ORS;  Service: General;  Laterality: Right;  . NO PAST SURGERIES     Social History   Socioeconomic History  . Marital status: Single    Spouse name: Not on file  . Number of children: Not on file  . Years of education: Not on file  . Highest education level: Not on file  Occupational History  . Not on file  Tobacco Use  . Smoking  status: Never Smoker  . Smokeless tobacco: Never Used  Substance and Sexual Activity  . Alcohol use: Yes    Alcohol/week: 3.0 standard drinks    Types: 3 Cans of beer per week  . Drug use: Yes    Types: Marijuana  . Sexual activity: Not on file  Other Topics Concern  . Not on file  Social History Narrative  . Not on file   Social Determinants of Health   Financial Resource Strain: Not on file  Food Insecurity: Not on file  Transportation Needs: Not on file  Physical Activity: Not on file  Stress: Not on file  Social Connections: Not on file  Intimate Partner Violence: Not on file   Family History  Problem Relation Age of Onset  . Heart failure Mother   . Obesity Brother    No current outpatient medications on file prior to visit.   No current facility-administered medications on file prior to visit.    Review of Systems Per HPI unless specifically indicated above      Objective:    BP 124/86   Pulse 91   Ht 6' 2.5" (1.892 m)   Wt 202 lb 3.2 oz (91.7 kg)   SpO2 99%   BMI 25.61 kg/m   Wt Readings from Last 3 Encounters:  08/19/20 202 lb 3.2 oz (91.7 kg)  05/17/18 192 lb (87.1 kg)  04/26/18 188 lb (85.3 kg)    Physical Exam Vitals and nursing note reviewed.  Constitutional:  General: He is not in acute distress.    Appearance: He is well-developed. He is not diaphoretic.     Comments: Well-appearing, comfortable, cooperative  HENT:     Head: Normocephalic and atraumatic.  Eyes:     General:        Right eye: No discharge.        Left eye: No discharge.     Conjunctiva/sclera: Conjunctivae normal.  Cardiovascular:     Rate and Rhythm: Normal rate.  Pulmonary:     Effort: Pulmonary effort is normal.  Musculoskeletal:     Comments: Bilateral hands 4th finger palmar each side with significant hypertrophic contracture of tendon, w/ Dupuytren's  Also has some DIP nodular bulkiness.  Skin:    General: Skin is warm and dry.     Findings: No  erythema or rash.  Neurological:     Mental Status: He is alert and oriented to person, place, and time.  Psychiatric:        Behavior: Behavior normal.     Comments: Well groomed, good eye contact, normal speech and thoughts       Results for orders placed or performed in visit on 12/21/18  Novel Coronavirus, NAA ([facility])   Specimen: Oropharyngeal(OP) collection in vial transport medium   OROPHARYNGEA  TESTING  Result Value Ref Range   SARS-CoV-2, NAA Not Detected Not Detected      Assessment & Plan:   Problem List Items Addressed This Visit    Primary osteoarthritis of both hands   Dupuytren's contracture of both hands - Primary   Relevant Orders   Ambulatory referral to Orthopedic Surgery    Other Visit Diagnoses    Encounter to establish care with new doctor          #Dupuytren's contracture both hands Bilateral hands with 4th finger/ring fingers both advanced severity Impacting daily function and work with pain and issue with grip  Refer to Dr Dominica Severin [facility] for hand specialist management, may consider injection vs surgical intervention.  Review prior records in chart.  Orders Placed This Encounter  Procedures  . Ambulatory referral to Orthopedic Surgery    Referral Priority:   Routine    Referral Type:   Surgical    Referral Reason:   Specialty Services Required    Requested Specialty:   Orthopedic Surgery    Number of Visits Requested:   1     No orders of the defined types were placed in this encounter.    Follow up plan: Return in about 4 months (around 12/20/2020) for 4 month follow-up AM apt for Annual Physical and fasting lab AFTER visit.  Saralyn Pilar, DO [facility] 08/19/2020, 3:31 PM

## 2020-08-27 ENCOUNTER — Telehealth: Payer: Self-pay

## 2020-08-27 NOTE — Telephone Encounter (Signed)
Pt  Said that the referral for his hands,  He could not get an appt until after August he is requesting  a new referral .

## 2020-08-27 NOTE — Telephone Encounter (Signed)
The last office visit note on 08/19/20 has all of the clinical details.  Referral was to Dr Amanda Pea (Hand Orthopedic specialist) in [place] at [facility].  Diagnosis was Dupuytren's Contracture bilateral ring fingers.  Unfortunately, there not many  Hand orthopedic specialists available.  We have one locally Dr Berta Minor at [facility] in [place].  Perhaps, Lowella Bandy can re route the previous referral to a different [facility] location - [place] w/ Dr Stephenie Acres?  I am not sure about the wait time. If she is unavailable sooner than August, then next options are further out of town - [address] or [address]  Saralyn Pilar, DO [facility] 08/27/2020, 7:46 PM

## 2020-08-29 NOTE — Telephone Encounter (Signed)
Can this be sent to [employer]

## 2021-02-23 ENCOUNTER — Ambulatory Visit
Admission: EM | Admit: 2021-02-23 | Discharge: 2021-02-23 | Disposition: A | Discharge status: Home / Self Care | Payer: Managed Care, Other (non HMO) | Attending: Emergency Medicine | Admitting: Emergency Medicine

## 2021-02-23 ENCOUNTER — Other Ambulatory Visit: Payer: Self-pay

## 2021-02-23 ENCOUNTER — Encounter: Payer: Self-pay | Admitting: Emergency Medicine

## 2021-02-23 DIAGNOSIS — J069 Acute upper respiratory infection, unspecified: Secondary | ICD-10-CM

## 2021-02-23 MED ORDER — IPRATROPIUM BROMIDE 0.06 % NA SOLN: 2.0000 | Freq: Four times a day (QID) | NASAL | 12 refills | Status: DC

## 2021-02-23 MED ORDER — PROMETHAZINE-DM 6.25-15 MG/5ML PO SYRP: 5.0000 mL | ORAL_SOLUTION | Freq: Four times a day (QID) | ORAL | 0 refills | Status: DC | PRN

## 2021-02-23 MED ORDER — BENZONATATE 100 MG PO CAPS: 200.0000 mg | ORAL_CAPSULE | Freq: Three times a day (TID) | ORAL | 0 refills | Status: DC

## 2021-02-23 NOTE — ED Provider Notes (Signed)
[facility]    CSN: [id number] Arrival date & time: 02/23/21  1514      History   Chief Complaint Chief Complaint  Patient presents with   Fatigue    HPI Michael [name] is a 50 y.o. male.   HPI  50 year old male here for evaluation of fatigue and weakness.  Patient reports that he has been feeling fatigued and weak for over a week.  He states that he has been under a lot of stress at work, moving at night while he works during the day, he recently attended a wedding, and he is in back in court with legal issues.  He states that his job has requested he come get checked out because he has not been as productive as usual at work.  Patient indicates that he ran 3 miles yesterday typically runs 3 to 4 miles daily.  Patient states that he has had a runny nose and nasal congestion but this is not unusual for him.  He denies any fever or sore throat.  When initially asked about cough he denied it but then states that he did have short episode of coughing yesterday that was productive for yellow sputum.  No GI complaints.  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 Authorizing Provider  benzonatate (TESSALON) 100 MG capsule Take 2 capsules (200  mg total) by mouth every 8 (eight) hours. 02/23/21  Yes Becky Augusta, NP  ipratropium (ATROVENT) 0.06 % nasal spray Place 2 sprays into both nostrils 4 (four) times daily. 02/23/21  Yes Becky Augusta, NP  promethazine-dextromethorphan (PROMETHAZINE-DM) 6.25-15 MG/5ML syrup Take 5 mLs by mouth 4 (four) times daily as needed. 02/23/21  Yes Becky Augusta, NP    Family History Family History  Problem Relation Age of Onset   Heart failure Mother    Obesity Brother     Social History Social History   Tobacco Use   Smoking status: Never   Smokeless tobacco: Never  Vaping Use   Vaping Use: Never used  Substance Use Topics   Alcohol use: Yes    Alcohol/week: 3.0 standard drinks    Types: 3 Cans of beer per week   Drug use: Yes    Types: Marijuana     Allergies   Patient has no known allergies.   Review of Systems Review of Systems  Constitutional:  Positive for fatigue. Negative for activity change, appetite change and fever.  HENT:  Positive for congestion and rhinorrhea. Negative for ear pain and sore throat.   Respiratory:  Positive for cough. Negative for shortness of breath and wheezing.   Gastrointestinal:  Negative for diarrhea, nausea and vomiting.  Musculoskeletal:  Negative for arthralgias and myalgias.  Skin:  Negative for rash.  Neurological:  Negative for weakness.  Hematological: Negative.   Psychiatric/Behavioral: Negative.      Physical Exam Triage Vital Signs ED Triage Vitals  Enc Vitals Group     BP 02/23/21 1543 (!) 127/91     Pulse Rate 02/23/21 1543 94     Resp 02/23/21 1543 16     Temp 02/23/21 1543 98 F (36.7 C)     Temp Source 02/23/21 1543 Oral     SpO2 02/23/21 1543 99 %     Weight 02/23/21 1540 210 lb (95.3 kg)     Height 02/23/21 1540 6\' 2"  (1.88 m)     Head Circumference --      Peak Flow --      Pain Score 02/23/21 1540 0     Pain Loc --      Pain Edu? --      Excl. in GC? --    No data found.  Updated Vital Signs BP (!) 127/91 (BP  Location: Left Arm)   Pulse 94   Temp 98 F (36.7 C) (Oral)   Resp 16   Ht 6\' 2"  (1.88 m)   Wt 210 lb (95.3 kg)   SpO2 99%   BMI 26.96 kg/m   Visual Acuity Right Eye Distance:   Left Eye Distance:   Bilateral Distance:    Right Eye Near:   Left Eye Near:    Bilateral Near:     Physical Exam Vitals and nursing note reviewed.  Constitutional:      General: He is not in acute distress.    Appearance: Normal appearance. He is not ill-appearing.  HENT:     Head: Normocephalic and atraumatic.     Right Ear: Tympanic membrane, ear canal and external ear normal. There is no impacted cerumen.     Left Ear: Tympanic membrane, ear canal and external ear normal. There is no impacted cerumen.     Nose: Congestion and rhinorrhea present.     Mouth/Throat:     Mouth: Mucous membranes are moist.     Pharynx: Oropharynx is clear. Posterior oropharyngeal erythema present.  Cardiovascular:     Rate and Rhythm: Normal rate and regular rhythm.     Pulses: Normal pulses.     Heart sounds: Normal heart sounds. No murmur heard.   No gallop.  Pulmonary:     Effort: Pulmonary effort is normal.     Breath sounds: Normal breath sounds. No wheezing, rhonchi or rales.  Musculoskeletal:     Cervical back: Normal range of motion and neck supple.  Lymphadenopathy:     Cervical: Cervical adenopathy present.  Skin:    General: Skin is warm and dry.     Capillary Refill: Capillary refill takes less than 2 seconds.     Findings: No erythema or rash.  Neurological:     General: No focal deficit present.     Mental Status: He is alert and oriented to person, place, and time.  Psychiatric:        Mood and Affect: Mood normal.        Behavior: Behavior normal.        Thought Content: Thought content normal.        Judgment: Judgment normal.     UC Treatments / Results  Labs (all labs ordered are listed, but only abnormal results are displayed) Labs Reviewed - No data to  display  EKG   Radiology No results found.  Procedures Procedures (including critical care time)  Medications Ordered in UC Medications - No data to display  Initial Impression / Assessment and Plan / UC Course  I have reviewed the triage vital signs and the nursing notes.  Pertinent labs & imaging results that were available during my care of the patient were reviewed by me and considered in my medical decision making (see chart for details).  Patient is a nontoxic-appearing 50 year old male here for evaluation of fatigue and upper respiratory symptoms as outlined in HPI above.  Patient's physical exam reveals pearly gray tympanic membranes bilaterally with normal light reflex and clear external auditory canals.  Nasal mucosa is erythematous and edematous with clear nasal discharge in both nares.  Oropharyngeal exam reveals posterior oropharyngeal erythema with clear postnasal drip.  Patient does have bilateral anterior cervical lymphadenopathy on exam.  Cardiopulmonary exam reveals clear lung sounds in all fields.  I suspect patient's fatigue is secondary to a viral upper respiratory infection.  We will treat with Atrovent nasal spray, Tessalon Perles, and Promethazine DM cough syrup.  Work note provided.   Final Clinical Impressions(s) / UC Diagnoses   Final diagnoses:  Viral URI with cough     Discharge Instructions      Use the Atrovent nasal spray, 2 squirts in each nostril every 6 hours, as needed for runny nose and postnasal drip.  Use the Tessalon Perles every 8 hours during the day.  Take them with a small sip of water.  They may give you some numbness to the base of your tongue or a metallic taste in your mouth, this is normal.  Use the Promethazine DM cough syrup at bedtime for cough and congestion.  It will make you drowsy so do not take it during the day.  Return for reevaluation or see your primary care provider for any new or worsening symptoms.      ED  Prescriptions     Medication Sig Dispense Auth. Provider   benzonatate (TESSALON) 100 MG capsule Take 2 capsules (200 mg total) by mouth every 8 (eight) hours. 21 capsule Margarette Canada, NP   ipratropium (ATROVENT) 0.06 % nasal spray Place 2 sprays into both nostrils 4 (four) times daily. 15 mL Margarette Canada, NP   promethazine-dextromethorphan (PROMETHAZINE-DM) 6.25-15 MG/5ML syrup Take 5 mLs by mouth 4 (four) times daily as needed. 118 mL Margarette Canada, NP      PDMP not reviewed this encounter.   Margarette Canada, NP 02/23/21 [phone]

## 2021-02-23 NOTE — ED Triage Notes (Signed)
Patient c/o fatigue and weakness that started a week ago.  Patient denies fevers and no cold symptoms.

## 2021-02-23 NOTE — Discharge Instructions (Signed)

## 2022-09-20 DIAGNOSIS — M4312 Spondylolisthesis, cervical region: Secondary | ICD-10-CM | POA: Diagnosis not present

## 2022-09-20 DIAGNOSIS — S3991XA Unspecified injury of abdomen, initial encounter: Secondary | ICD-10-CM | POA: Diagnosis not present

## 2022-09-20 DIAGNOSIS — S299XXA Unspecified injury of thorax, initial encounter: Secondary | ICD-10-CM | POA: Diagnosis not present

## 2022-09-20 DIAGNOSIS — R0902 Hypoxemia: Secondary | ICD-10-CM | POA: Diagnosis not present

## 2022-09-20 DIAGNOSIS — S3992XA Unspecified injury of lower back, initial encounter: Secondary | ICD-10-CM | POA: Diagnosis not present

## 2022-09-20 DIAGNOSIS — S82202A Unspecified fracture of shaft of left tibia, initial encounter for closed fracture: Secondary | ICD-10-CM | POA: Diagnosis not present

## 2022-09-20 DIAGNOSIS — S27329A Contusion of lung, unspecified, initial encounter: Secondary | ICD-10-CM | POA: Diagnosis not present

## 2022-09-20 DIAGNOSIS — R58 Hemorrhage, not elsewhere classified: Secondary | ICD-10-CM | POA: Diagnosis not present

## 2022-09-20 DIAGNOSIS — S70359A Superficial foreign body, unspecified thigh, initial encounter: Secondary | ICD-10-CM | POA: Diagnosis not present

## 2022-09-20 DIAGNOSIS — S27321A Contusion of lung, unilateral, initial encounter: Secondary | ICD-10-CM | POA: Diagnosis not present

## 2022-09-20 DIAGNOSIS — S42302A Unspecified fracture of shaft of humerus, left arm, initial encounter for closed fracture: Secondary | ICD-10-CM | POA: Diagnosis not present

## 2022-09-20 DIAGNOSIS — S022XXA Fracture of nasal bones, initial encounter for closed fracture: Secondary | ICD-10-CM | POA: Diagnosis not present

## 2022-09-20 DIAGNOSIS — S82252A Displaced comminuted fracture of shaft of left tibia, initial encounter for closed fracture: Secondary | ICD-10-CM | POA: Diagnosis not present

## 2022-09-20 DIAGNOSIS — S59902A Unspecified injury of left elbow, initial encounter: Secondary | ICD-10-CM | POA: Diagnosis not present

## 2022-09-20 DIAGNOSIS — S42322A Displaced transverse fracture of shaft of humerus, left arm, initial encounter for closed fracture: Secondary | ICD-10-CM | POA: Diagnosis not present

## 2022-09-20 DIAGNOSIS — S199XXA Unspecified injury of neck, initial encounter: Secondary | ICD-10-CM | POA: Diagnosis not present

## 2022-09-20 DIAGNOSIS — S3993XA Unspecified injury of pelvis, initial encounter: Secondary | ICD-10-CM | POA: Diagnosis not present

## 2022-09-20 DIAGNOSIS — R110 Nausea: Secondary | ICD-10-CM | POA: Diagnosis not present

## 2022-09-20 DIAGNOSIS — S82252B Displaced comminuted fracture of shaft of left tibia, initial encounter for open fracture type I or II: Secondary | ICD-10-CM | POA: Diagnosis not present

## 2022-09-20 DIAGNOSIS — S82202B Unspecified fracture of shaft of left tibia, initial encounter for open fracture type I or II: Secondary | ICD-10-CM | POA: Diagnosis not present

## 2022-09-20 DIAGNOSIS — S42302D Unspecified fracture of shaft of humerus, left arm, subsequent encounter for fracture with routine healing: Secondary | ICD-10-CM | POA: Diagnosis not present

## 2022-09-20 DIAGNOSIS — R918 Other nonspecific abnormal finding of lung field: Secondary | ICD-10-CM | POA: Diagnosis not present

## 2022-09-20 DIAGNOSIS — S82402A Unspecified fracture of shaft of left fibula, initial encounter for closed fracture: Secondary | ICD-10-CM | POA: Diagnosis not present

## 2022-09-20 DIAGNOSIS — S2232XA Fracture of one rib, left side, initial encounter for closed fracture: Secondary | ICD-10-CM | POA: Diagnosis not present

## 2022-09-21 DIAGNOSIS — S2232XA Fracture of one rib, left side, initial encounter for closed fracture: Secondary | ICD-10-CM | POA: Diagnosis not present

## 2022-09-21 DIAGNOSIS — M47812 Spondylosis without myelopathy or radiculopathy, cervical region: Secondary | ICD-10-CM | POA: Diagnosis not present

## 2022-09-21 DIAGNOSIS — S82202A Unspecified fracture of shaft of left tibia, initial encounter for closed fracture: Secondary | ICD-10-CM | POA: Diagnosis not present

## 2022-09-21 DIAGNOSIS — S199XXA Unspecified injury of neck, initial encounter: Secondary | ICD-10-CM | POA: Diagnosis not present

## 2022-09-21 DIAGNOSIS — M4312 Spondylolisthesis, cervical region: Secondary | ICD-10-CM | POA: Diagnosis not present

## 2022-09-21 DIAGNOSIS — S29009A Unspecified injury of muscle and tendon of unspecified wall of thorax, initial encounter: Secondary | ICD-10-CM | POA: Diagnosis not present

## 2022-09-21 DIAGNOSIS — S022XXA Fracture of nasal bones, initial encounter for closed fracture: Secondary | ICD-10-CM | POA: Diagnosis not present

## 2022-09-21 DIAGNOSIS — M4802 Spinal stenosis, cervical region: Secondary | ICD-10-CM | POA: Diagnosis not present

## 2022-09-22 DIAGNOSIS — G8911 Acute pain due to trauma: Secondary | ICD-10-CM | POA: Diagnosis not present

## 2022-09-22 DIAGNOSIS — S022XXA Fracture of nasal bones, initial encounter for closed fracture: Secondary | ICD-10-CM | POA: Diagnosis not present

## 2022-09-22 DIAGNOSIS — M4312 Spondylolisthesis, cervical region: Secondary | ICD-10-CM | POA: Diagnosis not present

## 2022-09-22 DIAGNOSIS — S2232XA Fracture of one rib, left side, initial encounter for closed fracture: Secondary | ICD-10-CM | POA: Diagnosis not present

## 2022-09-23 DIAGNOSIS — G8911 Acute pain due to trauma: Secondary | ICD-10-CM | POA: Diagnosis not present

## 2022-09-23 DIAGNOSIS — S022XXA Fracture of nasal bones, initial encounter for closed fracture: Secondary | ICD-10-CM | POA: Diagnosis not present

## 2022-09-23 DIAGNOSIS — S42302A Unspecified fracture of shaft of humerus, left arm, initial encounter for closed fracture: Secondary | ICD-10-CM | POA: Diagnosis not present

## 2022-09-23 DIAGNOSIS — G8918 Other acute postprocedural pain: Secondary | ICD-10-CM | POA: Diagnosis not present

## 2022-09-23 DIAGNOSIS — S42352A Displaced comminuted fracture of shaft of humerus, left arm, initial encounter for closed fracture: Secondary | ICD-10-CM | POA: Diagnosis not present

## 2022-09-23 DIAGNOSIS — S2232XA Fracture of one rib, left side, initial encounter for closed fracture: Secondary | ICD-10-CM | POA: Diagnosis not present

## 2022-09-24 DIAGNOSIS — S2232XA Fracture of one rib, left side, initial encounter for closed fracture: Secondary | ICD-10-CM | POA: Diagnosis not present

## 2022-09-24 DIAGNOSIS — S022XXA Fracture of nasal bones, initial encounter for closed fracture: Secondary | ICD-10-CM | POA: Diagnosis not present

## 2022-09-25 DIAGNOSIS — F1090 Alcohol use, unspecified, uncomplicated: Secondary | ICD-10-CM | POA: Diagnosis not present

## 2022-09-25 DIAGNOSIS — S2232XA Fracture of one rib, left side, initial encounter for closed fracture: Secondary | ICD-10-CM | POA: Diagnosis not present

## 2022-09-25 DIAGNOSIS — S022XXA Fracture of nasal bones, initial encounter for closed fracture: Secondary | ICD-10-CM | POA: Diagnosis not present

## 2022-09-25 DIAGNOSIS — G8911 Acute pain due to trauma: Secondary | ICD-10-CM | POA: Diagnosis not present

## 2022-09-26 DIAGNOSIS — E871 Hypo-osmolality and hyponatremia: Secondary | ICD-10-CM | POA: Diagnosis not present

## 2022-09-26 DIAGNOSIS — R000 Tachycardia, unspecified: Secondary | ICD-10-CM | POA: Diagnosis not present

## 2022-09-26 DIAGNOSIS — S022XXA Fracture of nasal bones, initial encounter for closed fracture: Secondary | ICD-10-CM | POA: Diagnosis not present

## 2022-09-26 DIAGNOSIS — S2232XA Fracture of one rib, left side, initial encounter for closed fracture: Secondary | ICD-10-CM | POA: Diagnosis not present

## 2022-09-27 DIAGNOSIS — S022XXA Fracture of nasal bones, initial encounter for closed fracture: Secondary | ICD-10-CM | POA: Diagnosis not present

## 2022-09-27 DIAGNOSIS — F1090 Alcohol use, unspecified, uncomplicated: Secondary | ICD-10-CM | POA: Diagnosis not present

## 2022-09-27 DIAGNOSIS — S2232XA Fracture of one rib, left side, initial encounter for closed fracture: Secondary | ICD-10-CM | POA: Diagnosis not present

## 2022-09-27 DIAGNOSIS — E871 Hypo-osmolality and hyponatremia: Secondary | ICD-10-CM | POA: Diagnosis not present

## 2022-09-28 DIAGNOSIS — G8911 Acute pain due to trauma: Secondary | ICD-10-CM | POA: Diagnosis not present

## 2022-09-28 DIAGNOSIS — S022XXA Fracture of nasal bones, initial encounter for closed fracture: Secondary | ICD-10-CM | POA: Diagnosis not present

## 2022-09-28 DIAGNOSIS — S2232XA Fracture of one rib, left side, initial encounter for closed fracture: Secondary | ICD-10-CM | POA: Diagnosis not present

## 2022-09-28 DIAGNOSIS — F1090 Alcohol use, unspecified, uncomplicated: Secondary | ICD-10-CM | POA: Diagnosis not present

## 2022-09-29 DIAGNOSIS — F1090 Alcohol use, unspecified, uncomplicated: Secondary | ICD-10-CM | POA: Diagnosis not present

## 2022-09-29 DIAGNOSIS — S022XXA Fracture of nasal bones, initial encounter for closed fracture: Secondary | ICD-10-CM | POA: Diagnosis not present

## 2022-09-29 DIAGNOSIS — S2232XA Fracture of one rib, left side, initial encounter for closed fracture: Secondary | ICD-10-CM | POA: Diagnosis not present

## 2022-09-29 DIAGNOSIS — G8911 Acute pain due to trauma: Secondary | ICD-10-CM | POA: Diagnosis not present

## 2022-09-30 DIAGNOSIS — S022XXA Fracture of nasal bones, initial encounter for closed fracture: Secondary | ICD-10-CM | POA: Diagnosis not present

## 2022-09-30 DIAGNOSIS — G8911 Acute pain due to trauma: Secondary | ICD-10-CM | POA: Diagnosis not present

## 2022-09-30 DIAGNOSIS — F1090 Alcohol use, unspecified, uncomplicated: Secondary | ICD-10-CM | POA: Diagnosis not present

## 2022-09-30 DIAGNOSIS — S2232XA Fracture of one rib, left side, initial encounter for closed fracture: Secondary | ICD-10-CM | POA: Diagnosis not present

## 2022-10-06 DIAGNOSIS — M4312 Spondylolisthesis, cervical region: Secondary | ICD-10-CM | POA: Diagnosis not present

## 2022-10-06 DIAGNOSIS — S42302D Unspecified fracture of shaft of humerus, left arm, subsequent encounter for fracture with routine healing: Secondary | ICD-10-CM | POA: Diagnosis not present

## 2022-10-06 DIAGNOSIS — S27329D Contusion of lung, unspecified, subsequent encounter: Secondary | ICD-10-CM | POA: Diagnosis not present

## 2022-10-06 DIAGNOSIS — F1090 Alcohol use, unspecified, uncomplicated: Secondary | ICD-10-CM | POA: Diagnosis not present

## 2022-10-06 DIAGNOSIS — S2232XD Fracture of one rib, left side, subsequent encounter for fracture with routine healing: Secondary | ICD-10-CM | POA: Diagnosis not present

## 2022-10-06 DIAGNOSIS — S82252D Displaced comminuted fracture of shaft of left tibia, subsequent encounter for closed fracture with routine healing: Secondary | ICD-10-CM | POA: Diagnosis not present

## 2022-10-07 ENCOUNTER — Telehealth: Payer: Self-pay | Admitting: Family Medicine

## 2022-10-07 ENCOUNTER — Encounter: Payer: Self-pay | Admitting: Family Medicine

## 2022-10-07 ENCOUNTER — Ambulatory Visit: Payer: Medicaid Other | Admitting: Family Medicine

## 2022-10-07 VITALS — BP 148/90 | HR 105 | Ht 74.0 in | Wt 225.0 lb

## 2022-10-07 DIAGNOSIS — S82202A Unspecified fracture of shaft of left tibia, initial encounter for closed fracture: Secondary | ICD-10-CM

## 2022-10-07 DIAGNOSIS — S42412A Displaced simple supracondylar fracture without intercondylar fracture of left humerus, initial encounter for closed fracture: Secondary | ICD-10-CM

## 2022-10-07 DIAGNOSIS — M79605 Pain in left leg: Secondary | ICD-10-CM | POA: Diagnosis not present

## 2022-10-07 DIAGNOSIS — M79602 Pain in left arm: Secondary | ICD-10-CM

## 2022-10-07 DIAGNOSIS — T07XXXA Unspecified multiple injuries, initial encounter: Secondary | ICD-10-CM

## 2022-10-07 MED ORDER — GABAPENTIN 300 MG PO CAPS
600.0000 mg | ORAL_CAPSULE | Freq: Three times a day (TID) | ORAL | 0 refills | Status: DC | PRN
Start: 2022-10-07 — End: 2022-10-20

## 2022-10-07 MED ORDER — OXYCODONE HCL 10 MG PO TABS
10.0000 mg | ORAL_TABLET | ORAL | 0 refills | Status: DC | PRN
Start: 2022-10-07 — End: 2022-10-20

## 2022-10-07 MED ORDER — METHOCARBAMOL 500 MG PO TABS
500.0000 mg | ORAL_TABLET | Freq: Three times a day (TID) | ORAL | 0 refills | Status: DC | PRN
Start: 2022-10-07 — End: 2022-10-20

## 2022-10-07 NOTE — Telephone Encounter (Signed)
Pt is calling in because the pharmacy says they need a prior authorization for  Oxycodone HCl 10 MG TABS [[id number]] before they fill it. Please advise.
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
## 2022-10-08 ENCOUNTER — Encounter: Payer: Self-pay | Admitting: Family Medicine

## 2022-10-08 MED ORDER — OXYCODONE HCL 10 MG PO TABS
10.0000 mg | ORAL_TABLET | ORAL | 0 refills | Status: DC | PRN
Start: 2022-10-14 — End: 2022-10-20

## 2022-10-08 NOTE — Telephone Encounter (Addendum)
Can you notify patient?   I have re ordered Oxycodone for next week 10/14/22 fill date.  He should be eligible to go pick up this new order on 6/26  Also if he cannot get the full rx ifi they only can do 30 pills, he may be able to purchase and pay cash for the remaining pills that insurance doesn't cover. I am not sure how that process works but it should be an option.  Saralyn Pilar, DO [facility] 10/08/2022, 11:44 AM

## 2022-10-08 NOTE — Patient Instructions (Signed)
° °  Please schedule a Follow-up Appointment to: No follow-ups on file. ° °If you have any other questions or concerns, please feel free to call the office or send a message through MyChart. You may also schedule an earlier appointment if necessary. ° °Additionally, you may be receiving a survey about your experience at our office within a few days to 1 week by e-mail or mail. We value your feedback. ° °Makynleigh Breslin, DO °[facility] °

## 2022-10-08 NOTE — Telephone Encounter (Signed)
Patient aware.

## 2022-10-08 NOTE — Addendum Note (Signed)
Addended by: [name] on: 10/08/2022 11:44 AM   Modules accepted: Orders

## 2022-10-08 NOTE — Telephone Encounter (Signed)
Spoke with [place] [facility] and they say oxycodone is ready for pick up at the [place] location.  Attempted to reach patient but no answer and unable to leave voicemail.

## 2022-10-08 NOTE — Telephone Encounter (Signed)
Patient received 30 pills/5 day supply.  Per pharmacy insurance only allows this.  They will need a new script for him to get refills after 5 days.  Patient is concerned he will run out of medication before his appointment with ortho.

## 2022-10-09 DIAGNOSIS — S82252D Displaced comminuted fracture of shaft of left tibia, subsequent encounter for closed fracture with routine healing: Secondary | ICD-10-CM | POA: Diagnosis not present

## 2022-10-09 DIAGNOSIS — F1090 Alcohol use, unspecified, uncomplicated: Secondary | ICD-10-CM | POA: Diagnosis not present

## 2022-10-09 DIAGNOSIS — S42302D Unspecified fracture of shaft of humerus, left arm, subsequent encounter for fracture with routine healing: Secondary | ICD-10-CM | POA: Diagnosis not present

## 2022-10-09 DIAGNOSIS — S27329D Contusion of lung, unspecified, subsequent encounter: Secondary | ICD-10-CM | POA: Diagnosis not present

## 2022-10-09 DIAGNOSIS — M4312 Spondylolisthesis, cervical region: Secondary | ICD-10-CM | POA: Diagnosis not present

## 2022-10-09 DIAGNOSIS — S2232XD Fracture of one rib, left side, subsequent encounter for fracture with routine healing: Secondary | ICD-10-CM | POA: Diagnosis not present

## 2022-10-13 DIAGNOSIS — S42302D Unspecified fracture of shaft of humerus, left arm, subsequent encounter for fracture with routine healing: Secondary | ICD-10-CM | POA: Diagnosis not present

## 2022-10-13 DIAGNOSIS — S82252D Displaced comminuted fracture of shaft of left tibia, subsequent encounter for closed fracture with routine healing: Secondary | ICD-10-CM | POA: Diagnosis not present

## 2022-10-13 DIAGNOSIS — S27329D Contusion of lung, unspecified, subsequent encounter: Secondary | ICD-10-CM | POA: Diagnosis not present

## 2022-10-13 DIAGNOSIS — S2232XD Fracture of one rib, left side, subsequent encounter for fracture with routine healing: Secondary | ICD-10-CM | POA: Diagnosis not present

## 2022-10-13 DIAGNOSIS — F1090 Alcohol use, unspecified, uncomplicated: Secondary | ICD-10-CM | POA: Diagnosis not present

## 2022-10-13 DIAGNOSIS — M4312 Spondylolisthesis, cervical region: Secondary | ICD-10-CM | POA: Diagnosis not present

## 2022-10-15 DIAGNOSIS — S82252E Displaced comminuted fracture of shaft of left tibia, subsequent encounter for open fracture type I or II with routine healing: Secondary | ICD-10-CM | POA: Diagnosis not present

## 2022-10-16 DIAGNOSIS — S82252D Displaced comminuted fracture of shaft of left tibia, subsequent encounter for closed fracture with routine healing: Secondary | ICD-10-CM | POA: Diagnosis not present

## 2022-10-16 DIAGNOSIS — F1090 Alcohol use, unspecified, uncomplicated: Secondary | ICD-10-CM | POA: Diagnosis not present

## 2022-10-16 DIAGNOSIS — S27329D Contusion of lung, unspecified, subsequent encounter: Secondary | ICD-10-CM | POA: Diagnosis not present

## 2022-10-16 DIAGNOSIS — S2232XD Fracture of one rib, left side, subsequent encounter for fracture with routine healing: Secondary | ICD-10-CM | POA: Diagnosis not present

## 2022-10-16 DIAGNOSIS — S42302D Unspecified fracture of shaft of humerus, left arm, subsequent encounter for fracture with routine healing: Secondary | ICD-10-CM | POA: Diagnosis not present

## 2022-10-16 DIAGNOSIS — M4312 Spondylolisthesis, cervical region: Secondary | ICD-10-CM | POA: Diagnosis not present

## 2022-10-19 ENCOUNTER — Ambulatory Visit: Payer: Medicaid Other | Admitting: Internal Medicine

## 2022-10-20 ENCOUNTER — Ambulatory Visit: Payer: Medicaid Other | Admitting: Family Medicine

## 2022-10-20 ENCOUNTER — Encounter: Payer: Self-pay | Admitting: Family Medicine

## 2022-10-20 DIAGNOSIS — S82252D Displaced comminuted fracture of shaft of left tibia, subsequent encounter for closed fracture with routine healing: Secondary | ICD-10-CM | POA: Diagnosis not present

## 2022-10-20 DIAGNOSIS — M4312 Spondylolisthesis, cervical region: Secondary | ICD-10-CM | POA: Diagnosis not present

## 2022-10-20 DIAGNOSIS — S27329D Contusion of lung, unspecified, subsequent encounter: Secondary | ICD-10-CM | POA: Diagnosis not present

## 2022-10-20 DIAGNOSIS — S82202D Unspecified fracture of shaft of left tibia, subsequent encounter for closed fracture with routine healing: Secondary | ICD-10-CM | POA: Diagnosis not present

## 2022-10-20 DIAGNOSIS — T07XXXA Unspecified multiple injuries, initial encounter: Secondary | ICD-10-CM

## 2022-10-20 DIAGNOSIS — S2232XD Fracture of one rib, left side, subsequent encounter for fracture with routine healing: Secondary | ICD-10-CM | POA: Diagnosis not present

## 2022-10-20 DIAGNOSIS — M79602 Pain in left arm: Secondary | ICD-10-CM

## 2022-10-20 DIAGNOSIS — S42412D Displaced simple supracondylar fracture without intercondylar fracture of left humerus, subsequent encounter for fracture with routine healing: Secondary | ICD-10-CM | POA: Diagnosis not present

## 2022-10-20 DIAGNOSIS — M79605 Pain in left leg: Secondary | ICD-10-CM | POA: Diagnosis not present

## 2022-10-20 DIAGNOSIS — F1090 Alcohol use, unspecified, uncomplicated: Secondary | ICD-10-CM | POA: Diagnosis not present

## 2022-10-20 DIAGNOSIS — S42302D Unspecified fracture of shaft of humerus, left arm, subsequent encounter for fracture with routine healing: Secondary | ICD-10-CM | POA: Diagnosis not present

## 2022-10-20 MED ORDER — METHOCARBAMOL 500 MG PO TABS
1000.0000 mg | ORAL_TABLET | Freq: Three times a day (TID) | ORAL | 2 refills | Status: DC | PRN
Start: 2022-10-20 — End: 2023-04-23

## 2022-10-20 MED ORDER — GABAPENTIN 300 MG PO CAPS: 600.0000 mg | ORAL_CAPSULE | Freq: Three times a day (TID) | ORAL | 2 refills | Status: DC | PRN

## 2022-10-20 MED ORDER — OXYCODONE HCL 10 MG PO TABS
10.0000 mg | ORAL_TABLET | ORAL | 0 refills | Status: DC | PRN
Start: 2022-10-20 — End: 2022-10-29

## 2022-10-20 NOTE — Patient Instructions (Addendum)
Thank you for coming to the office today.  New rx Oxycodone 10mg  take up to 3 to 4 per day for up to 2 week supply  Re order Methocarbamol muscle relaxant and gabapentin.  For Constipation (less frequent bowel movement that can be hard dry or involve straining).  Recommend trying OTC Miralax 17g = 1 capful in large glass water once daily for now, try several days to see if working, goal is soft stool or BM 1-2 times daily, if too loose then reduce dose or try every other day. If not effective may need to increase it to 2 doses at once in AM or may do 1 in morning and 1 in afternoon/evening  - This medicine is very safe and can be used often without any problem and will not make you dehydrated. It is good for use on AS NEEDED BASIS or even MAINTENANCE therapy for longer term for several days to weeks at a time to help regulate bowel movements  Other more natural remedies or preventative treatment: - Increase hydration with water - Increase fiber in diet (high fiber foods = vegetables, leafy greens, oats/grains) - May take OTC Fiber supplement (metamucil powder or pill/gummy) - May try OTC Probiotic   Please schedule a Follow-up Appointment to: Return in about 4 weeks (around 11/17/2022), or if symptoms worsen or fail to improve.  If you have any other questions or concerns, please feel free to call the office or send a message through MyChart. You may also schedule an earlier appointment if necessary.  Additionally, you may be receiving a survey about your experience at our office within a few days to 1 week by e-mail or mail. We value your feedback.  Saralyn Pilar, DO [facility] [place]
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
## 2022-10-21 DIAGNOSIS — S2232XD Fracture of one rib, left side, subsequent encounter for fracture with routine healing: Secondary | ICD-10-CM | POA: Diagnosis not present

## 2022-10-21 DIAGNOSIS — S27329D Contusion of lung, unspecified, subsequent encounter: Secondary | ICD-10-CM | POA: Diagnosis not present

## 2022-10-21 DIAGNOSIS — S82252D Displaced comminuted fracture of shaft of left tibia, subsequent encounter for closed fracture with routine healing: Secondary | ICD-10-CM | POA: Diagnosis not present

## 2022-10-21 DIAGNOSIS — S42302D Unspecified fracture of shaft of humerus, left arm, subsequent encounter for fracture with routine healing: Secondary | ICD-10-CM | POA: Diagnosis not present

## 2022-10-21 DIAGNOSIS — F1090 Alcohol use, unspecified, uncomplicated: Secondary | ICD-10-CM | POA: Diagnosis not present

## 2022-10-21 DIAGNOSIS — M4312 Spondylolisthesis, cervical region: Secondary | ICD-10-CM | POA: Diagnosis not present

## 2022-10-26 DIAGNOSIS — S82252D Displaced comminuted fracture of shaft of left tibia, subsequent encounter for closed fracture with routine healing: Secondary | ICD-10-CM | POA: Diagnosis not present

## 2022-10-26 DIAGNOSIS — S42302D Unspecified fracture of shaft of humerus, left arm, subsequent encounter for fracture with routine healing: Secondary | ICD-10-CM | POA: Diagnosis not present

## 2022-10-26 DIAGNOSIS — S2232XD Fracture of one rib, left side, subsequent encounter for fracture with routine healing: Secondary | ICD-10-CM | POA: Diagnosis not present

## 2022-10-26 DIAGNOSIS — M4312 Spondylolisthesis, cervical region: Secondary | ICD-10-CM | POA: Diagnosis not present

## 2022-10-26 DIAGNOSIS — S27329D Contusion of lung, unspecified, subsequent encounter: Secondary | ICD-10-CM | POA: Diagnosis not present

## 2022-10-26 DIAGNOSIS — F1090 Alcohol use, unspecified, uncomplicated: Secondary | ICD-10-CM | POA: Diagnosis not present

## 2022-10-29 ENCOUNTER — Other Ambulatory Visit: Payer: Self-pay | Admitting: Family Medicine

## 2022-10-29 DIAGNOSIS — S42412D Displaced simple supracondylar fracture without intercondylar fracture of left humerus, subsequent encounter for fracture with routine healing: Secondary | ICD-10-CM

## 2022-10-29 DIAGNOSIS — S82252D Displaced comminuted fracture of shaft of left tibia, subsequent encounter for closed fracture with routine healing: Secondary | ICD-10-CM | POA: Diagnosis not present

## 2022-10-29 DIAGNOSIS — T07XXXA Unspecified multiple injuries, initial encounter: Secondary | ICD-10-CM

## 2022-10-29 DIAGNOSIS — F1090 Alcohol use, unspecified, uncomplicated: Secondary | ICD-10-CM | POA: Diagnosis not present

## 2022-10-29 DIAGNOSIS — S27329D Contusion of lung, unspecified, subsequent encounter: Secondary | ICD-10-CM | POA: Diagnosis not present

## 2022-10-29 DIAGNOSIS — M4312 Spondylolisthesis, cervical region: Secondary | ICD-10-CM | POA: Diagnosis not present

## 2022-10-29 DIAGNOSIS — S42302D Unspecified fracture of shaft of humerus, left arm, subsequent encounter for fracture with routine healing: Secondary | ICD-10-CM | POA: Diagnosis not present

## 2022-10-29 DIAGNOSIS — S82202D Unspecified fracture of shaft of left tibia, subsequent encounter for closed fracture with routine healing: Secondary | ICD-10-CM

## 2022-10-29 DIAGNOSIS — S42352D Displaced comminuted fracture of shaft of humerus, left arm, subsequent encounter for fracture with routine healing: Secondary | ICD-10-CM | POA: Diagnosis not present

## 2022-10-29 DIAGNOSIS — S2232XD Fracture of one rib, left side, subsequent encounter for fracture with routine healing: Secondary | ICD-10-CM | POA: Diagnosis not present

## 2022-10-29 NOTE — Telephone Encounter (Signed)
Medication Refill - Medication: Oxycodone HCl 10 MG TABS   Has the patient contacted their pharmacy? Yes.   Pharmacy advised they would only give him a 5 day supply on 7/2 and provider would need to call for an override  Preferred Pharmacy (with phone number or street name):  [id number] [place], [place] - [address] Phone: [phone]  Fax: [phone]     Has the patient been seen for an appointment in the last year OR does the patient have an upcoming appointment? No.  Agent: Please be advised that RX refills may take up to 3 business days. We ask that you follow-up with your pharmacy.

## 2022-10-30 MED ORDER — OXYCODONE HCL 10 MG PO TABS
10.0000 mg | ORAL_TABLET | ORAL | 0 refills | Status: DC | PRN
Start: 2022-10-30 — End: 2022-11-05

## 2022-10-30 NOTE — Telephone Encounter (Signed)
Requested medication (s) are due for refill today: routing for review  Requested medication (s) are on the active medication list: yes  Last refill:  10/20/22  Future visit scheduled: yes  Notes to clinic:  Unable to refill per protocol, cannot delegate.      Requested Prescriptions  Pending Prescriptions Disp Refills   Oxycodone HCl 10 MG TABS 56 tablet 0    Sig: Take 1 tablet (10 mg total) by mouth every 4 (four) hours as needed.     Not Delegated - Analgesics:  Opioid Agonists Failed - 10/29/2022  3:33 PM      Failed - This refill cannot be delegated      Failed - Urine Drug Screen completed in last 360 days      Passed - Valid encounter within last 3 months    Recent Outpatient Visits           1 week ago Left leg pain   [facility] [place], Netta Neat, DO   3 weeks ago Left arm pain   [place] [facility] [name], DO   2 years ago Dupuytren's contracture of both hands   [place] [facility] [place], Netta Neat, DO       Future Appointments             In 2 weeks Althea Charon, Netta Neat, DO  [facility], [facility]

## 2022-11-05 ENCOUNTER — Ambulatory Visit: Payer: Self-pay | Admitting: *Deleted

## 2022-11-05 ENCOUNTER — Other Ambulatory Visit: Payer: Self-pay | Admitting: Family Medicine

## 2022-11-05 DIAGNOSIS — T07XXXA Unspecified multiple injuries, initial encounter: Secondary | ICD-10-CM

## 2022-11-05 DIAGNOSIS — S42412D Displaced simple supracondylar fracture without intercondylar fracture of left humerus, subsequent encounter for fracture with routine healing: Secondary | ICD-10-CM

## 2022-11-05 DIAGNOSIS — S82202D Unspecified fracture of shaft of left tibia, subsequent encounter for closed fracture with routine healing: Secondary | ICD-10-CM

## 2022-11-05 NOTE — Telephone Encounter (Signed)
Medication Refill - Medication: Oxycodone HCl 10 MG TABS,   Has the patient contacted their pharmacy? No. No, more refills.  (Agent: If no, request that the patient contact the pharmacy for the refill. If patient does not wish to contact the pharmacy document the reason why and proceed with request.)   Preferred Pharmacy (with phone number or street name):  [id number] [place], [place] - [address]  [address] [place] [address]  Phone: [phone] Fax: [phone]  Hours: Not open 24 hours   Has the patient been seen for an appointment in the last year OR does the patient have an upcoming appointment? Yes.    Agent: Please be advised that RX refills may take up to 3 business days. We ask that you follow-up with your pharmacy.
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
## 2022-11-06 MED ORDER — OXYCODONE HCL 10 MG PO TABS
10.0000 mg | ORAL_TABLET | ORAL | 0 refills | Status: DC | PRN
Start: 2022-11-06 — End: 2022-11-12

## 2022-11-06 NOTE — Telephone Encounter (Signed)
Requested medications are due for refill today.  no  Requested medications are on the active medications list.  yes  Last refill. 10/30/2022 #56 0 rf  Future visit scheduled.   yes  Notes to clinic.  Refill not delegated.    Requested Prescriptions  Pending Prescriptions Disp Refills   Oxycodone HCl 10 MG TABS 56 tablet 0    Sig: Take 1 tablet (10 mg total) by mouth every 4 (four) hours as needed.     Not Delegated - Analgesics:  Opioid Agonists Failed - 11/05/2022 11:39 AM      Failed - This refill cannot be delegated      Failed - Urine Drug Screen completed in last 360 days      Passed - Valid encounter within last 3 months    Recent Outpatient Visits           2 weeks ago Left leg pain   [place] [facility] [place], Netta Neat, DO   1 month ago Left arm pain   [place] [facility] [name], DO   2 years ago Dupuytren's contracture of both hands   [place] [facility] [place], Netta Neat, DO       Future Appointments             In 1 week Althea Charon, Netta Neat, DO  [facility], [facility]

## 2022-11-12 ENCOUNTER — Other Ambulatory Visit: Payer: Self-pay | Admitting: Family Medicine

## 2022-11-12 DIAGNOSIS — S42412D Displaced simple supracondylar fracture without intercondylar fracture of left humerus, subsequent encounter for fracture with routine healing: Secondary | ICD-10-CM

## 2022-11-12 DIAGNOSIS — S82202D Unspecified fracture of shaft of left tibia, subsequent encounter for closed fracture with routine healing: Secondary | ICD-10-CM

## 2022-11-12 DIAGNOSIS — T07XXXA Unspecified multiple injuries, initial encounter: Secondary | ICD-10-CM

## 2022-11-12 NOTE — Telephone Encounter (Signed)
Requested medication (s) are due for refill today - provider review   Requested medication (s) are on the active medication list -yes  Future visit scheduled -yes  Last refill: 11/06/22 #56  Notes to clinic: non delegated Rx  Requested Prescriptions  Pending Prescriptions Disp Refills   Oxycodone HCl 10 MG TABS 56 tablet 0    Sig: Take 1 tablet (10 mg total) by mouth every 4 (four) hours as needed.     Not Delegated - Analgesics:  Opioid Agonists Failed - 11/12/2022 12:08 PM      Failed - This refill cannot be delegated      Failed - Urine Drug Screen completed in last 360 days      Passed - Valid encounter within last 3 months    Recent Outpatient Visits           3 weeks ago Left leg pain   [place] [facility] [place], Netta Neat, DO   1 month ago Left arm pain   [place] [facility] [name], DO   2 years ago Dupuytren's contracture of both hands   [place] [facility] [name], DO       Future Appointments             In 5 days Althea Charon, Netta Neat, DO [place] [facility], [facility]               Requested Prescriptions  Pending Prescriptions Disp Refills   Oxycodone HCl 10 MG TABS 56 tablet 0    Sig: Take 1 tablet (10 mg total) by mouth every 4 (four) hours as needed.     Not Delegated - Analgesics:  Opioid Agonists Failed - 11/12/2022 12:08 PM      Failed - This refill cannot be delegated      Failed - Urine Drug Screen completed in last 360 days      Passed - Valid encounter within last 3 months    Recent Outpatient Visits           3 weeks ago Left leg pain   [place] [facility] [place], Netta Neat, DO   1 month ago Left arm pain   [place] [facility] [name], DO   2 years ago Dupuytren's contracture of both hands   [place] [facility] [place],  Netta Neat, DO       Future Appointments             In 5 days Althea Charon, Netta Neat, DO  [facility], [facility]

## 2022-11-12 NOTE — Telephone Encounter (Signed)
Medication Refill - Medication: Oxycodone HCl 10 MG TABS Pt states he only has 2 pills left. Pt has an appt on 11/17/2022 with his PCP and has an appt with his bone doctor on 12/18/2022.    Has the patient contacted their pharmacy? No.   Preferred Pharmacy (with phone number or street name): [id number] [place], [place] - [address]  Phone: [phone] Fax: [phone]  Has the patient been seen for an appointment in the last year OR does the patient have an upcoming appointment? Yes.    Agent: Please be advised that RX refills may take up to 3 business days. We ask that you follow-up with your pharmacy.

## 2022-11-13 MED ORDER — OXYCODONE HCL 10 MG PO TABS
10.0000 mg | ORAL_TABLET | ORAL | 0 refills | Status: DC | PRN
Start: 2022-11-13 — End: 2022-11-17

## 2022-11-16 DIAGNOSIS — S42352D Displaced comminuted fracture of shaft of humerus, left arm, subsequent encounter for fracture with routine healing: Secondary | ICD-10-CM | POA: Diagnosis not present

## 2022-11-16 DIAGNOSIS — S82252D Displaced comminuted fracture of shaft of left tibia, subsequent encounter for closed fracture with routine healing: Secondary | ICD-10-CM | POA: Diagnosis not present

## 2022-11-17 ENCOUNTER — Ambulatory Visit: Payer: Medicaid Other | Admitting: Family Medicine

## 2022-11-17 ENCOUNTER — Encounter: Payer: Self-pay | Admitting: Family Medicine

## 2022-11-17 DIAGNOSIS — S82202D Unspecified fracture of shaft of left tibia, subsequent encounter for closed fracture with routine healing: Secondary | ICD-10-CM | POA: Diagnosis not present

## 2022-11-17 DIAGNOSIS — S42412D Displaced simple supracondylar fracture without intercondylar fracture of left humerus, subsequent encounter for fracture with routine healing: Secondary | ICD-10-CM | POA: Diagnosis not present

## 2022-11-17 DIAGNOSIS — T07XXXA Unspecified multiple injuries, initial encounter: Secondary | ICD-10-CM

## 2022-11-17 MED ORDER — OXYCODONE HCL 10 MG PO TABS
10.0000 mg | ORAL_TABLET | ORAL | 0 refills | Status: AC | PRN
Start: 2022-11-17 — End: 2022-12-01

## 2022-11-17 NOTE — Patient Instructions (Addendum)
Thank you for coming to the office today.  New order Oxycodone 10mg , 84 pill count for 14 days, avg 6 pills per day as needed. Sent to pharmacy, goal to pick up on Friday 8/2  Keep up with Ortho and on antibiotic course.  Call or request re order when due, approximately 2 weeks.  Please schedule a Follow-up Appointment to: Return in about 2 weeks (around 12/01/2022), or if symptoms worsen or fail to improve.  If you have any other questions or concerns, please feel free to call the office or send a message through MyChart. You may also schedule an earlier appointment if necessary.  Additionally, you may be receiving a survey about your experience at our office within a few days to 1 week by e-mail or mail. We value your feedback.  Saralyn Pilar, DO [facility], [place]
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
## 2022-11-18 ENCOUNTER — Encounter: Payer: Self-pay | Admitting: Obstetrics and Gynecology

## 2022-11-18 ENCOUNTER — Other Ambulatory Visit: Payer: Managed Care, Other (non HMO) | Admitting: Obstetrics and Gynecology

## 2022-11-18 NOTE — Patient Outreach (Signed)
Care Coordination  11/18/2022  Michael [name] Apr 02, 1971 [id number]  RNCM called patient at scheduled time, patient answered and asked to be called another time.  RNCM scheduled another initial appointment.  Kathi Der RN, BSN [place]  [employer], production - Managed Medicaid High Risk [phone].

## 2022-12-07 ENCOUNTER — Other Ambulatory Visit: Payer: Medicaid Other | Admitting: Obstetrics and Gynecology

## 2022-12-07 ENCOUNTER — Encounter: Payer: Self-pay | Admitting: Obstetrics and Gynecology

## 2022-12-07 NOTE — Patient Outreach (Signed)
Medicaid Managed Care   Nurse Care Manager Note  12/07/2022 Name:  Michael [name] MRN:  [id number] DOB:  09-24-1970  Michael [name] is an 52 y.o. year old male who is a primary patient of Michael Cords, DO.  The [facility] Managed Care Coordination team was consulted for assistance with:    Chronic healthcare management needs,  Osteoarthritis, - Midshaft left humerus fracture - Comminuted midshaft tibia fracture - Minimally displaced left anterior seventh rib fracture  Michael [name] was given information about Medicaid Managed Care Coordination team services today. Michael [name] Patient agreed to services and verbal consent obtained.  Engaged with patient by telephone for initial visit in response to provider referral for case management and/or care coordination services.   Assessments/Interventions:  Review of past medical history, allergies, medications, health status, including review of consultants reports, laboratory and other test data, was performed as part of comprehensive evaluation and provision of chronic care management services.  SDOH (Social Determinants of Health) assessments and interventions performed: SDOH Interventions    Flowsheet Row Patient Outreach Telephone from 12/07/2022 in [place] POPULATION HEALTH DEPARTMENT Patient Outreach Telephone from 11/18/2022 in  POPULATION HEALTH DEPARTMENT Office Visit from 10/20/2022 in [facility]  SDOH Interventions     Housing Interventions -- Intervention Not Indicated --  Utilities Interventions -- Intervention Not Indicated --  Alcohol Usage Interventions Intervention Not Indicated (Score <7) -- --  Depression Interventions/Treatment  -- -- Counseling  Stress Interventions Intervention Not Indicated -- --  Health Literacy Interventions -- Intervention Not Indicated --     Care Plan No Known Allergies  Medications Reviewed Today     Reviewed by Michael Chandler, RN (Registered Nurse) on 12/07/22 at [phone]  Med List Status: <None>   Medication Order Taking? Sig Documenting Provider Last Dose Status Informant  acetaminophen (TYLENOL) 500 MG tablet [id number] No Take by mouth. [provider] Taking Active   gabapentin (NEURONTIN) 300 MG capsule [id number] No Take 2 capsules (600 mg total) by mouth 3 (three) times daily as needed (pain). Michael Cords, DO Taking Active   ibuprofen (ADVIL) 200 MG tablet [id number] No Take 200 mg by mouth every 6 (six) hours as needed. [provider] Taking Active   methocarbamol (ROBAXIN) 500 MG tablet [id number] No Take 2 tablets (1,000 mg total) by mouth every 8 (eight) hours as needed for muscle spasms. Michael Cords, DO Taking Active             Patient Active Problem List   Diagnosis Date Noted   Dupuytren's contracture of both hands 08/19/2020   Primary osteoarthritis of both hands 08/19/2020   Psychoactive substance-induced psychosis (HCC) 02/02/2018   Brief reactive psychosis (HCC) 01/31/2018   Conditions to be addressed/monitored per PCP order:  Osteoarthritis, - Midshaft left humerus fracture - Comminuted midshaft tibia fracture - Minimally displaced left anterior seventh rib fracture  Care Plan : RN Care Manager Plan of Care  Updates made by Michael Chandler, RN since 12/07/2022 12:00 AM     Problem: Health Promotion or Disease Self-Management (General Plan of Care)      Long-Range Goal: Chronic Disease Management   Start Date: 12/07/2022  Expected End Date: 03/09/2023  Priority: High  Note:   Current Barriers:  Knowledge Deficits related to plan of care for management of Osteoarthritis, - Midshaft left humerus fracture - Comminuted midshaft tibia fracture - Minimally displaced left anterior seventh rib fracture  RNCM Clinical  Goal(s):  Patient will verbalize understanding of plan for management of Osteoarthritis, Midshaft left humerus fracture -  Comminuted midshaft tibia fracture - Minimally displaced left anterior seventh rib fracture  as evidenced by patient report verbalize basic understanding of  Osteoarthritis, - Midshaft left humerus fracture - Comminuted midshaft tibia fracture - Minimally displaced left anterior seventh rib fracture  and self health management plan as evidenced by patient report take all medications exactly as prescribed and will call provider for medication related questions as evidenced by patient report demonstrate understanding of rationale for each prescribed medication as evidenced by patient report attend all scheduled medical appointments as evidenced by patient report Demonstrate ongoing  adherence to prescribed treatment plan for Osteoarthritis, - Midshaft left humerus fracture - Comminuted midshaft tibia fracture - Minimally displaced left anterior seventh rib fracture as evidenced by patient report and EMR review continue to work with RN Care Manager to address care management and care coordination needs related to Osteoarthritis, - Midshaft left humerus fracture - Comminuted midshaft tibia fracture - Minimally displaced left anterior seventh rib fracture   as evidenced by adherence to CM Team Scheduled appointments through collaboration with RN Care manager, provider, and care team.   Interventions: Inter-disciplinary care team collaboration (see longitudinal plan of care) Evaluation of current treatment plan related to Osteoarthritis, - Midshaft left humerus fracture - Comminuted midshaft tibia fracture - Minimally displaced left anterior seventh rib fracture  self management and patient's adherence to plan as established by provider    (Status:  New goal.)  Long Term Goal Evaluation of current treatment plan related to  Osteoarthritis, - Midshaft left humerus fracture - Comminuted midshaft tibia fracture - Minimally displaced left anterior seventh rib fracture   self-management and patient's  adherence to plan as established by provider. Discussed plans with patient for ongoing care management follow up and provided patient with direct contact information for care management team Evaluation of current treatment plan related to  Osteoarthritis, - Midshaft left humerus fracture - Comminuted midshaft tibia fracture - Minimally displaced left anterior seventh rib fracture  and patient's adherence to plan as established by provider Reviewed medications with patient  Reviewed scheduled/upcoming provider appointments  Discussed plans with patient for ongoing care management follow up and provided patient with direct contact information for care management team Assessed social determinant of health barriers  Patient Goals/Self-Care Activities: Take all medications as prescribed Attend all scheduled provider appointments Call pharmacy for medication refills 3-7 days in advance of running out of medications Perform all self care activities independently  Perform IADL's (shopping, preparing meals, housekeeping, managing finances) independently Call provider office for new concerns or questions   Follow Up Plan:  The patient has been provided with contact information for the care management team and has been advised to call with any health related questions or concerns.  The care management team will reach out to the patient again over the next 30 business  days.     Long-Range Goal: Establish Plan of Care for Chronic Disease Management Needs   Priority: High  Note:   Timeframe:  Long-Range Goal Priority:  High Start Date:   12/07/22                          Expected End Date:    ongoing                   Follow Up Date 01/11/23    - schedule appointment  for vaccines needed due to my age or health - schedule recommended health tests  - schedule and keep appointment for annual check-up    Why is this important?   Screening tests can find diseases early when they are easier to treat.   Your doctor or nurse will talk with you about which tests are important for you.  Getting shots for common diseases like the flu and shingles will help prevent them.     Follow Up:  Patient agrees to Care Plan and Follow-up.  Plan: The Managed Medicaid care management team will reach out to the patient again over the next 30 business  days. and The  Patient has been provided with contact information for the Managed Medicaid care management team and has been advised to call with any health related questions or concerns.  Date/time of next scheduled RN care management/care coordination outreach: 01/11/23 at 0900

## 2022-12-07 NOTE — Patient Instructions (Signed)
Hi Michael [name], thank you for speaking with me this morning-I hope you have a good day and feel better.  Michael [name] was given information about Medicaid Managed Care team care coordination services as a part of their [facility] [facility] Medicaid benefit. Michael [name] verbally consented to engagement with the [facility] Managed Care team.   If you are experiencing a medical emergency, please call 911 or report to your local emergency department or urgent care.   If you have a non-emergency medical problem during routine business hours, please contact your provider's office and ask to speak with a nurse.   For questions related to your [facility], please call: [phone] or visit the homepage here: [email or web address]  If you would like to schedule transportation through your [facility], please call the following number at least 2 days in advance of your appointment: [phone]   Rides for urgent appointments can also be made after hours by calling Member Services.  Call the Behavioral Health Crisis Line at [phone], at any time, 24 hours a day, 7 days a week. If you are in danger or need immediate medical attention call 911.  If you would like help to quit smoking, call 1-800-QUIT-NOW (507-175-5790) OR Espaol: 1-855-Djelo-Ya (3-664-403-4742) o para ms informacin haga clic aqu or Text READY to 595-638 to register via text  Michael [name] - following are the goals we discussed in your visit today:   Goals Addressed    Timeframe:  Long-Range Goal Priority:  High Start Date:   12/07/22                          Expected End Date:    ongoing                   Follow Up Date 01/11/23    - schedule appointment for vaccines needed due to my age or health - schedule recommended health tests  - schedule and keep appointment for annual check-up    Why  is this important?   Screening tests can find diseases early when they are easier to treat.  Your doctor or nurse will talk with you about which tests are important for you.  Getting shots for common diseases like the flu and shingles will help prevent them.    Patient verbalizes understanding of instructions and care plan provided today and agrees to view in MyChart. Active MyChart status and patient understanding of how to access instructions and care plan via MyChart confirmed with patient.     The Managed Medicaid care management team will reach out to the patient again over the next 30 business  days.  The  Patient  has been provided with contact information for the Managed Medicaid care management team and has been advised to call with any health related questions or concerns.   Kathi Der RN, BSN [place]  [facility] Care Management Coordinator - Managed Medicaid High Risk [phone]   Following is a copy of your plan of care:  Care Plan : RN Care Manager Plan of Care  Updates made by [name], RN since 12/07/2022 12:00 AM     Problem: Health Promotion or Disease Self-Management (General Plan of Care)      Long-Range Goal: Chronic Disease Management   Start Date: 12/07/2022  Expected End Date: 03/09/2023  Priority: High  Note:   Current Barriers:  Knowledge Deficits related  to plan of care for management of Osteoarthritis, - Midshaft left humerus fracture - Comminuted midshaft tibia fracture - Minimally displaced left anterior seventh rib fracture  RNCM Clinical Goal(s):  Patient will verbalize understanding of plan for management of Osteoarthritis, Midshaft left humerus fracture - Comminuted midshaft tibia fracture - Minimally displaced left anterior seventh rib fracture  as evidenced by patient report verbalize basic understanding of  Osteoarthritis, - Midshaft left humerus fracture - Comminuted midshaft tibia fracture - Minimally displaced  left anterior seventh rib fracture  and self health management plan as evidenced by patient report take all medications exactly as prescribed and will call provider for medication related questions as evidenced by patient report demonstrate understanding of rationale for each prescribed medication as evidenced by patient report attend all scheduled medical appointments as evidenced by patient report Demonstrate ongoing  adherence to prescribed treatment plan for Osteoarthritis, - Midshaft left humerus fracture - Comminuted midshaft tibia fracture - Minimally displaced left anterior seventh rib fracture as evidenced by patient report and EMR review continue to work with RN Care Manager to address care management and care coordination needs related to Osteoarthritis, - Midshaft left humerus fracture - Comminuted midshaft tibia fracture - Minimally displaced left anterior seventh rib fracture   as evidenced by adherence to CM Team Scheduled appointments through collaboration with RN Care manager, provider, and care team.   Interventions: Inter-disciplinary care team collaboration (see longitudinal plan of care) Evaluation of current treatment plan related to Osteoarthritis, - Midshaft left humerus fracture - Comminuted midshaft tibia fracture - Minimally displaced left anterior seventh rib fracture  self management and patient's adherence to plan as established by provider    (Status:  New goal.)  Long Term Goal Evaluation of current treatment plan related to  Osteoarthritis, - Midshaft left humerus fracture - Comminuted midshaft tibia fracture - Minimally displaced left anterior seventh rib fracture   self-management and patient's adherence to plan as established by provider. Discussed plans with patient for ongoing care management follow up and provided patient with direct contact information for care management team Evaluation of current treatment plan related to  Osteoarthritis, - Midshaft  left humerus fracture - Comminuted midshaft tibia fracture - Minimally displaced left anterior seventh rib fracture  and patient's adherence to plan as established by provider Reviewed medications with patient  Reviewed scheduled/upcoming provider appointments  Discussed plans with patient for ongoing care management follow up and provided patient with direct contact information for care management team Assessed social determinant of health barriers  Patient Goals/Self-Care Activities: Take all medications as prescribed Attend all scheduled provider appointments Call pharmacy for medication refills 3-7 days in advance of running out of medications Perform all self care activities independently  Perform IADL's (shopping, preparing meals, housekeeping, managing finances) independently Call provider office for new concerns or questions   Follow Up Plan:  The patient has been provided with contact information for the care management team and has been advised to call with any health related questions or concerns.  The care management team will reach out to the patient again over the next 30 business  days.

## 2022-12-09 ENCOUNTER — Ambulatory Visit: Payer: Self-pay

## 2022-12-09 DIAGNOSIS — T07XXXA Unspecified multiple injuries, initial encounter: Secondary | ICD-10-CM

## 2022-12-09 DIAGNOSIS — M79605 Pain in left leg: Secondary | ICD-10-CM

## 2022-12-09 MED ORDER — OXYCODONE HCL 10 MG PO TABS
10.0000 mg | ORAL_TABLET | ORAL | 0 refills | Status: DC | PRN
Start: 2022-12-09 — End: 2022-12-22

## 2022-12-09 NOTE — Addendum Note (Signed)
Addended by: [name] on: 12/09/2022 05:50 PM   Modules accepted: Orders

## 2022-12-09 NOTE — Telephone Encounter (Signed)
Rx Oxycodone sent to [facility].  Saralyn Pilar, DO [facility] 12/09/2022, 5:50 PM

## 2022-12-09 NOTE — Telephone Encounter (Signed)
    Chief Complaint: Left shin pain, sees orthopedic doctor next week. Asking for refill on Oxycodone Symptoms: Pain  7/10 Frequency: 12 weeks Pertinent Negatives: Patient denies  Disposition: [] ED /[] Urgent Care (no appt availability in office) / [] Appointment(In office/virtual)/ []  [place] Virtual [facility]/ [] Home Care/ [x] Refused Recommended Disposition /[] [place] Mobile [facility]/ []  Follow-up with PCP Additional Notes: Requests refill of pain medication.  Reason for Disposition  [1] SEVERE pain (e.g., excruciating, unable to do any normal activities) AND [2] not improved after 2 hours of pain medicine  Answer Assessment - Initial Assessment Questions 1. ONSET: "When did the pain start?"      12 weeks 2. LOCATION: "Where is the pain located?"      Left leg - shin 3. PAIN: "How bad is the pain?"    (Scale 1-10; or mild, moderate, severe)   -  MILD (1-3): doesn't interfere with normal activities    -  MODERATE (4-7): interferes with normal activities (e.g., work or school) or awakens from sleep, limping    -  SEVERE (8-10): excruciating pain, unable to do any normal activities, unable to walk     7 4. WORK OR EXERCISE: "Has there been any recent work or exercise that involved this part of the body?"      No 5. CAUSE: "What do you think is causing the leg pain?"     Open wound 6. OTHER SYMPTOMS: "Do you have any other symptoms?" (e.g., chest pain, back pain, breathing difficulty, swelling, rash, fever, numbness, weakness)     Pain 7. PREGNANCY: "Is there any chance you are pregnant?" "When was your last menstrual period?"     N/a  Protocols used: Leg Pain-A-AH

## 2022-12-11 NOTE — Telephone Encounter (Signed)
Called patient and left VM for patient to call back. PEC please give Dr Malachi Paradise message to patient. Patient needs to pay out of pocket for medication because insurance only covers 7 tabs.   -Michael [name]

## 2022-12-11 NOTE — Telephone Encounter (Signed)
Can you notify patient that insurance will not cover 100% of the pill quantity on his pain med Oxycodone? They sent [place] a fax. They only cover 7 day count. He will have to cover the cost of the rest or only pick up less pills.  Thank you  Saralyn Pilar, DO [facility] 12/11/2022, 11:52 AM

## 2022-12-15 DIAGNOSIS — S82252D Displaced comminuted fracture of shaft of left tibia, subsequent encounter for closed fracture with routine healing: Secondary | ICD-10-CM | POA: Diagnosis not present

## 2022-12-15 DIAGNOSIS — M4312 Spondylolisthesis, cervical region: Secondary | ICD-10-CM | POA: Diagnosis not present

## 2022-12-15 DIAGNOSIS — S2232XD Fracture of one rib, left side, subsequent encounter for fracture with routine healing: Secondary | ICD-10-CM | POA: Diagnosis not present

## 2022-12-15 DIAGNOSIS — S42302D Unspecified fracture of shaft of humerus, left arm, subsequent encounter for fracture with routine healing: Secondary | ICD-10-CM | POA: Diagnosis not present

## 2022-12-15 DIAGNOSIS — S27329D Contusion of lung, unspecified, subsequent encounter: Secondary | ICD-10-CM | POA: Diagnosis not present

## 2022-12-15 DIAGNOSIS — F1090 Alcohol use, unspecified, uncomplicated: Secondary | ICD-10-CM | POA: Diagnosis not present

## 2022-12-22 ENCOUNTER — Ambulatory Visit: Payer: Self-pay

## 2022-12-22 DIAGNOSIS — M79605 Pain in left leg: Secondary | ICD-10-CM

## 2022-12-22 DIAGNOSIS — T07XXXA Unspecified multiple injuries, initial encounter: Secondary | ICD-10-CM

## 2022-12-22 MED ORDER — OXYCODONE HCL 10 MG PO TABS
10.0000 mg | ORAL_TABLET | ORAL | 0 refills | Status: AC | PRN
Start: 2022-12-22 — End: ?

## 2022-12-22 NOTE — Telephone Encounter (Signed)
I attempted to call 9/3 after 5pm, did not reach patient.  I agreed to send refill [facility]  Patient will need to follow-up with Ortho and determine a treatment plan going forward. I can see him at next visit. He may need Pain Management referral coming up soon if we cannot resolve this issue.  Saralyn Pilar, DO [facility] 12/22/2022, 5:10 PM

## 2022-12-22 NOTE — Telephone Encounter (Signed)
Summary: wound on leg   Patient states that wound on leg is not getting any better and is still painful. Patient is requesting pain medication. Patient is scheduled for next available OV with provider on 9/17.         Chief Complaint: Pt. Has continued leg pain due to open wound. Has 3 pain pills left. Sees ortho 12/24/22. Asking for refill on pain medication until then. Symptoms: Pain, unable to weight-bear  Frequency: June Pertinent Negatives: Patient denies  Disposition: [] ED /[] Urgent Care (no appt availability in office) / [] Appointment(In office/virtual)/ []  [place] Virtual [facility]/ [] Home Care/ [x] Refused Recommended Disposition /[]  Mobile Bus/ [x]  Follow-up with PCP Additional Notes: Please advise pt.  Reason for Disposition  [1] MODERATE pain (e.g., interferes with normal activities, limping) AND [2] present > 3 days  Answer Assessment - Initial Assessment Questions 1. ONSET: "When did the pain start?"      June 2. LOCATION: "Where is the pain located?"      Leg pain, open wound 3. PAIN: "How bad is the pain?"    (Scale 1-10; or mild, moderate, severe)   -  MILD (1-3): doesn't interfere with normal activities    -  MODERATE (4-7): interferes with normal activities (e.g., work or school) or awakens from sleep, limping    -  SEVERE (8-10): excruciating pain, unable to do any normal activities, unable to walk     Can't put weight on it 4. WORK OR EXERCISE: "Has there been any recent work or exercise that involved this part of the body?"      No 5. CAUSE: "What do you think is causing the leg pain?"     Open wound 6. OTHER SYMPTOMS: "Do you have any other symptoms?" (e.g., chest pain, back pain, breathing difficulty, swelling, rash, fever, numbness, weakness)     Pain 7. PREGNANCY: "Is there any chance you are pregnant?" "When was your last menstrual period?"     N/a  Protocols used: Leg Pain-A-AH

## 2022-12-23 NOTE — Telephone Encounter (Signed)
Pt advised.   Thanks,   -Laura  

## 2022-12-24 DIAGNOSIS — S42302D Unspecified fracture of shaft of humerus, left arm, subsequent encounter for fracture with routine healing: Secondary | ICD-10-CM | POA: Diagnosis not present

## 2022-12-24 DIAGNOSIS — S82252E Displaced comminuted fracture of shaft of left tibia, subsequent encounter for open fracture type I or II with routine healing: Secondary | ICD-10-CM | POA: Diagnosis not present

## 2022-12-24 DIAGNOSIS — S82252D Displaced comminuted fracture of shaft of left tibia, subsequent encounter for closed fracture with routine healing: Secondary | ICD-10-CM | POA: Diagnosis not present

## 2023-01-05 ENCOUNTER — Ambulatory Visit: Payer: Medicaid Other | Admitting: Family Medicine

## 2023-01-11 ENCOUNTER — Other Ambulatory Visit: Payer: Medicaid Other | Admitting: Obstetrics and Gynecology

## 2023-01-11 NOTE — Patient Instructions (Signed)
Hi [name], thank you for updating me this morning-have a terrific day!  [name] was given information about Medicaid Managed Care team care coordination services as a part of their [facility] Community Plan Medicaid benefit. Michael [name] verbally consented to engagement with the [facility] Managed Care team.   If you are experiencing a medical emergency, please call 911 or report to your local emergency department or urgent care.   If you have a non-emergency medical problem during routine business hours, please contact your provider's office and ask to speak with a nurse.   For questions related to your [facility], please call: [phone] or visit the homepage here: [email or web address]  If you would like to schedule transportation through your [facility], please call the following number at least 2 days in advance of your appointment: [phone]   Rides for urgent appointments can also be made after hours by calling Member Services.  Call the Behavioral Health Crisis Line at [phone], at any time, 24 hours a day, 7 days a week. If you are in danger or need immediate medical attention call 911.  If you would like help to quit smoking, call 1-800-QUIT-NOW (4020711458) OR Espaol: 1-855-Djelo-Ya (3-474-259-5638) o para ms informacin haga clic aqu or Text READY to 756-433 to register via text  Michael [name] - following are the goals we discussed in your visit today:   Goals Addressed    Timeframe:  Long-Range Goal Priority:  High Start Date:   12/07/22                          Expected End Date:    ongoing                   Follow Up Date 02/10/23   - schedule appointment for vaccines needed due to my age or health - schedule recommended health tests  - schedule and keep appointment for annual check-up    Why is this important?    Screening tests can find diseases early when they are easier to treat.  Your doctor or nurse will talk with you about which tests are important for you.  Getting shots for common diseases like the flu and shingles will help prevent them.   01/11/23: continues to follow with PCP and ORTHO  Michael [name] verbalizes understanding of instructions and care plan provided today and agrees to view in MyChart. Active MyChart status and Michael [name] understanding of how to access instructions and care plan via MyChart confirmed with Michael [name].     The Managed Medicaid care management team will reach out to the Michael [name] again over the next 30 business  days.  The  Michael [name] has been provided with contact information for the Managed Medicaid care management team and has been advised to call with any health related questions or concerns.   Michael Der RN, BSN [place]  [facility] Care Management Coordinator - Managed Medicaid High Risk [phone]    Following is a copy of your plan of care:  Care Plan : RN Care Manager Plan of Care  Updates made by Michael Chandler, RN since 01/11/2023 12:00 AM     Problem: Health Promotion or Disease Self-Management (General Plan of Care)      Long-Range Goal: Chronic Disease Management   Start Date: 12/07/2022  Expected End Date: 03/09/2023  Priority: High  Note:   Current Barriers:  Knowledge Deficits related  to plan of care for management of Osteoarthritis, - Midshaft left humerus fracture - Comminuted midshaft tibia fracture - Minimally displaced left anterior seventh rib fracture 01/11/23: Pain in left leg with weight bearing-has not been able to return to work yet.  Has f/u appts with PCP and ORTHO scheduled.  Taking Tylenol for pain.  Rates pain as a 5.  Continues doing home exercises.  Trouble paying bills-is depleting savings.    RNCM Clinical Goal(s):  Michael [name] will verbalize understanding of plan for management of Osteoarthritis, Midshaft left  humerus fracture - Comminuted midshaft tibia fracture - Minimally displaced left anterior seventh rib fracture  as evidenced by Michael [name] report verbalize basic understanding of  Osteoarthritis, - Midshaft left humerus fracture - Comminuted midshaft tibia fracture - Minimally displaced left anterior seventh rib fracture  and self health management plan as evidenced by Michael [name] report take all medications exactly as prescribed and will call provider for medication related questions as evidenced by Michael [name] report demonstrate understanding of rationale for each prescribed medication as evidenced by Michael [name] report attend all scheduled medical appointments as evidenced by Michael [name] report Demonstrate ongoing  adherence to prescribed treatment plan for Osteoarthritis, - Midshaft left humerus fracture - Comminuted midshaft tibia fracture - Minimally displaced left anterior seventh rib fracture as evidenced by Michael [name] report and EMR review continue to work with RN Care Manager to address care management and care coordination needs related to Osteoarthritis, - Midshaft left humerus fracture - Comminuted midshaft tibia fracture - Minimally displaced left anterior seventh rib fracture   as evidenced by adherence to CM Team Scheduled appointments through collaboration with RN Care manager, provider, and care team.   Interventions: Inter-disciplinary care team collaboration (see longitudinal plan of care) Evaluation of current treatment plan related to Osteoarthritis, - Midshaft left humerus fracture - Comminuted midshaft tibia fracture - Minimally displaced left anterior seventh rib fracture  self management and Michael [name]'s adherence to plan as established by provider Collaborated with BSW.   BSW referral for resources    (Status:  New goal.)  Long Term Goal Evaluation of current treatment plan related to  Osteoarthritis, - Midshaft left humerus fracture - Comminuted midshaft tibia fracture - Minimally  displaced left anterior seventh rib fracture   self-management and Michael [name]'s adherence to plan as established by provider. Discussed plans with Michael [name] for ongoing care management follow up and provided Michael [name] with direct contact information for care management team Evaluation of current treatment plan related to  Osteoarthritis, - Midshaft left humerus fracture - Comminuted midshaft tibia fracture - Minimally displaced left anterior seventh rib fracture  and Michael [name]'s adherence to plan as established by provider Reviewed medications with Michael [name]  Reviewed scheduled/upcoming provider appointments  Discussed plans with Michael [name] for ongoing care management follow up and provided Michael [name] with direct contact information for care management team Assessed social determinant of health barriers  Michael [name] Goals/Self-Care Activities: Take all medications as prescribed Attend all scheduled provider appointments Call pharmacy for medication refills 3-7 days in advance of running out of medications Perform all self care activities independently  Perform IADL's (shopping, preparing meals, housekeeping, managing finances) independently Call provider office for new concerns or questions  01/11/23:  Michael [name] to f/u with providers/employer regarding return to work, disability.  Follow Up Plan:  The Michael [name] has been provided with contact information for the care management team and has been advised to call with any health related questions or concerns.  The care management team will reach out to the Michael [name] again over the next  30 business  days.
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
## 2023-01-13 ENCOUNTER — Other Ambulatory Visit: Payer: Medicaid Other
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
## 2023-01-20 ENCOUNTER — Ambulatory Visit: Payer: Medicaid Other | Admitting: Family Medicine

## 2023-01-20 ENCOUNTER — Encounter: Payer: Self-pay | Admitting: Family Medicine

## 2023-01-20 VITALS — BP 130/78 | HR 89 | Ht 74.0 in | Wt 210.0 lb

## 2023-01-20 DIAGNOSIS — M79605 Pain in left leg: Secondary | ICD-10-CM | POA: Diagnosis not present

## 2023-01-20 DIAGNOSIS — S42412D Displaced simple supracondylar fracture without intercondylar fracture of left humerus, subsequent encounter for fracture with routine healing: Secondary | ICD-10-CM | POA: Diagnosis not present

## 2023-01-20 DIAGNOSIS — M7989 Other specified soft tissue disorders: Secondary | ICD-10-CM | POA: Diagnosis not present

## 2023-01-20 DIAGNOSIS — T07XXXA Unspecified multiple injuries, initial encounter: Secondary | ICD-10-CM

## 2023-01-20 DIAGNOSIS — S82202D Unspecified fracture of shaft of left tibia, subsequent encounter for closed fracture with routine healing: Secondary | ICD-10-CM | POA: Diagnosis not present

## 2023-01-20 MED ORDER — FUROSEMIDE 20 MG PO TABS
20.0000 mg | ORAL_TABLET | Freq: Every day | ORAL | 0 refills | Status: DC
Start: 2023-01-20 — End: 2023-02-09

## 2023-01-20 MED ORDER — OXYCODONE HCL 10 MG PO TABS
10.0000 mg | ORAL_TABLET | ORAL | 0 refills | Status: DC | PRN
Start: 2023-01-20 — End: 2023-02-09

## 2023-01-20 MED ORDER — MUPIROCIN 2 % EX OINT
1.0000 | TOPICAL_OINTMENT | Freq: Two times a day (BID) | CUTANEOUS | 1 refills | Status: DC
Start: 2023-01-20 — End: 2024-02-14

## 2023-01-20 NOTE — Patient Instructions (Addendum)
Thank you for coming to the office today.  Discuss the surgery situation / hardware and chronic pain with Dr Imogene Burn in 1 month, if they can consider 2nd opinion that would be good, ask them. Or we can help if needed.  Leg swelling  Use RICE therapy: - R - Rest / relative rest with activity modification avoid overuse of joint - I - Ice packs (make sure you use a towel or sock / something to protect skin) - C - Compression with ACE wrap to apply pressure and reduce swelling allowing more support - E - Elevation - if significant swelling, lift leg above heart level (toes above your nose) to help reduce swelling, most helpful at night after day of being on your feet  Use Furosemide fluid pill for swelling - 20mg  take 1 to 2 pills as needed for swelling, over course of 3-7 days then can use as needed.  Re order Oxycodone  Use Mupirocin topical antibiotic ointment up to 2 weeks. Then as needed.   Please schedule a Follow-up Appointment to: Return in about 6 weeks (around 03/03/2023) for 5 weeks follow-up after 11/4 - Follow up Ortho.  If you have any other questions or concerns, please feel free to call the office or send a message through MyChart. You may also schedule an earlier appointment if necessary.  Additionally, you may be receiving a survey about your experience at our office within a few days to 1 week by e-mail or mail. We value your feedback.  Saralyn Pilar, DO [facility], [place]

## 2023-01-20 NOTE — Progress Notes (Signed)
Subjective:    Patient ID: Michael [name], male    DOB: 02/25/1971, 52 y.o.   MRN: [id number]  Michael [name] is a 53 y.o. male presenting on 01/20/2023 for Follow-up (Recheck leg)   HPI  Discussed the use of AI scribe software for clinical note transcription with the patient, who gave verbal consent to proceed.     The patient, with a history of orthopedic surgery, presents with persistent pain and swelling in the lower extremity  He is followed by [facility] Dr Michael [name] Last visit to me 2 months ago end of July 2024. He has been managed on pain medication from our office. He has completed several antibiotic courses for redness and swelling. He has hardware in place per Orthopedics  New issue now, approximately four to five weeks after a fall. The fall occurred on a rainy day when the patient slipped on concrete that was wet and had a new fall. The patient attempted to break the fall with his arm, resulting in skin abrasions, and felt a pop in the leg where a rod had been previously placed. It worsened pain and swelling. He scheduled to return to Ortho and they saw him 12/24/22 and did X-rays and eval. Determined no abnormality of the hardware  Still has difficulty weight bearing and pain  The patient describes the pain as severe, rating it an 8 on a scale of 10 when the leg is swollen. The patient has been managing the pain with Percocet, with relief. Other OTC meds Tylenol, ibuprofen not helping as much. He also takes Gabapentin and muscle relaxant.  The patient also reports persistent swelling of the lower extremity, which worsens with prolonged standing or walking. The swelling is so severe that it causes visible distension of the leg and foot by the end of the day. The patient has been trying to manage the swelling by elevating the leg, but this has not provided significant relief.  In addition to the pain and swelling, the patient has a non-healing wound on the leg, which  continues to seep fluid. Despite three courses of antibiotics, the wound has not fully closed.   The patient expresses frustration and distress over the ongoing symptoms and functional limitations, which have prevented him from returning to work and participating in planned activities. The patient is eager to find a solution to these issues and is open to seeking a second opinion if necessary.           01/20/2023   11:43 AM 10/20/2022    5:03 PM  Depression screen PHQ 2/9  Decreased Interest 1 0  Down, Depressed, Hopeless 1 1  PHQ - 2 Score 2 1  Altered sleeping 0 3  Tired, decreased energy 0 0  Change in appetite 0 1  Feeling bad or failure about yourself  1 2  Trouble concentrating  2  Moving slowly or fidgety/restless 0 1  Suicidal thoughts 0 0  PHQ-9 Score 3 10  Difficult doing work/chores Not difficult at all Not difficult at all    Social History   Tobacco Use   Smoking status: Never   Smokeless tobacco: Never  Vaping Use   Vaping status: Never Used  Substance Use Topics   Alcohol use: Not Currently    Alcohol/week: 3.0 standard drinks of alcohol    Types: 3 Cans of beer per week   Drug use: Yes    Types: Marijuana    Review of Systems Per HPI unless specifically  indicated above     Objective:    BP 130/78   Pulse 89   Ht 6\' 2"  (1.88 m)   Wt 210 lb (95.3 kg)   SpO2 98%   BMI 26.96 kg/m   Wt Readings from Last 3 Encounters:  01/20/23 210 lb (95.3 kg)  11/17/22 212 lb (96.2 kg)  10/20/22 211 lb 6.4 oz (95.9 kg)    Physical Exam Vitals and nursing note reviewed.  Constitutional:      General: He is not in acute distress.    Appearance: Normal appearance. He is well-developed. He is not diaphoretic.     Comments: Well-appearing, comfortable, cooperative  HENT:     Head: Normocephalic and atraumatic.  Eyes:     General:        Right eye: No discharge.        Left eye: No discharge.     Conjunctiva/sclera: Conjunctivae normal.  Cardiovascular:      Rate and Rhythm: Normal rate.  Pulmonary:     Effort: Pulmonary effort is normal.  Musculoskeletal:     Comments: Left lower extremity difficulty with weight bearing, has crutch today. Mostly healed except central 1-2 cm central divet with scab. No oozing now, some significant edema RLE with some warmth. No cellulitis  Skin:    General: Skin is warm and dry.     Findings: No erythema or rash.  Neurological:     Mental Status: He is alert and oriented to person, place, and time.  Psychiatric:        Mood and Affect: Mood normal.        Behavior: Behavior normal.        Thought Content: Thought content normal.     Comments: Well groomed, good eye contact, normal speech and thoughts      I have personally reviewed the radiology report from 12/24/22 on X-ray.  Impression  Unchanged position and alignment of the healing fracture of the shaft of the humerus following screw-plate fixation. Narrative  EXAM: XR HUMERUS LEFT DATE: 12/24/2022 9:09 AM ACCESSION: [id number] UN DICTATED: 12/24/2022 9:52 AM INTERPRETATION LOCATION: [facility]  CLINICAL INDICATION: 52 years old Male with left humeral shaft fx  - S42.302D - Closed fracture of shaft of left humerus with routine healing, unspecified fracture morphology, subsequent encounter      COMPARISON: Radiograph left humerus 11/16/2022  TECHNIQUE: AP and lateral views of the left humerus.  FINDINGS: Unchanged position and alignment of the mid humerus shaft fracture after screw-plate fixation. Osseous bridging and remodeling have progressed at the fracture. The glenohumeral and elbow joints remain approximated. No new fracture or osteolysis about the fixation hardware..   Impression  Healing internally fixed comminuted tibial shaft fracture without any adverse hardware features. Narrative  EXAM: XR TIBIA FIBULA LEFT DATE: 12/24/2022 9:09 AM ACCESSION: [id number] UN DICTATED: 12/24/2022 9:42 AM INTERPRETATION LOCATION: Main  [facility]  CLINICAL INDICATION: 52 years old Male with left tibial shaft fx  - S82.252E - Type I or II open displaced comminuted fracture of shaft of left tibia with routine healing, subsequent encounter      COMPARISON: Prior radiographs dated 11/16/2022 and earlier  TECHNIQUE: AP and lateral views of the left tibia and fibula.  FINDINGS: Comminuted fracture distal tibial shaft transfixed with interlocking intramedullary rod and screws shows progressive callus formation across the fracture clefts. No perihardware lucency or hardware fracture demonstrated. No soft tissue swelling seen. Joint spaces are preserved. No joint effusion seen.    Results for orders placed  or performed in visit on 12/21/18  Novel Coronavirus, NAA ([facility])   Specimen: Oropharyngeal(OP) collection in vial transport medium   OROPHARYNGEA  TESTING  Result Value Ref Range   SARS-CoV-2, NAA Not Detected Not Detected      Assessment & Plan:   Problem List Items Addressed This Visit   None Visit Diagnoses     Left leg pain    -  Primary   Relevant Medications   Oxycodone HCl 10 MG TABS   Closed supracondylar fracture of left humerus with routine healing, subsequent encounter       Closed fracture of shaft of left tibia with routine healing, unspecified fracture morphology, subsequent encounter       Left leg swelling       Relevant Medications   mupirocin ointment (BACTROBAN) 2 %   furosemide (LASIX) 20 MG tablet   Multiple traumatic injuries       Relevant Medications   Oxycodone HCl 10 MG TABS       Assessment and Plan    Post-operative from multiple trauma fracture injuries Followed by Orthopedic surgery - [facility] Dr Michael [name] Hardware in place Persistent pain, swelling, and non-healing wound at surgical site. Now new fall injury  Review PDMP -Continue Oxycodone 10mg  as needed for pain -Start Furosemide 20mg  daily for 3 days, then pause for a few days to manage swelling. That I believe is  contributing worsening pain. -Apply Mupirocin antibiotic healing ointment to the wound twice daily for 2 weeks. -Consider referral to [facility] wound care center if no improvement in 2 weeks.  -Discuss persistent pain and possible hardware issues with orthopedic surgeon at next appointment on November 4th, 2024. Consider second opinion if necessary.  Insect bites Multiple bites causing itching and discomfort. -Continue Benadryl as needed for itching.       Meds ordered this encounter  Medications   Oxycodone HCl 10 MG TABS    Sig: Take 1 tablet (10 mg total) by mouth every 4 (four) hours as needed (severe pain).    Dispense:  84 tablet    Refill:  0   mupirocin ointment (BACTROBAN) 2 %    Sig: Apply 1 Application topically 2 (two) times daily. For 2 weeks and repeat if needed for scab, wound healing.    Dispense:  22 g    Refill:  1   furosemide (LASIX) 20 MG tablet    Sig: Take 1-2 tablets (20-40 mg total) by mouth daily.    Dispense:  30 tablet    Refill:  0      Follow up plan: Return in about 6 weeks (around 03/03/2023) for 5 weeks follow-up after 11/4 - Follow up Ortho.  Saralyn Pilar, DO [facility] 01/20/2023, 11:45 AM

## 2023-02-09 ENCOUNTER — Ambulatory Visit: Payer: Self-pay

## 2023-02-09 ENCOUNTER — Other Ambulatory Visit: Payer: Self-pay | Admitting: Family Medicine

## 2023-02-09 DIAGNOSIS — T07XXXA Unspecified multiple injuries, initial encounter: Secondary | ICD-10-CM

## 2023-02-09 DIAGNOSIS — M79605 Pain in left leg: Secondary | ICD-10-CM

## 2023-02-09 DIAGNOSIS — M7989 Other specified soft tissue disorders: Secondary | ICD-10-CM

## 2023-02-09 MED ORDER — OXYCODONE HCL 10 MG PO TABS
10.0000 mg | ORAL_TABLET | ORAL | 0 refills | Status: DC | PRN
Start: 2023-02-09 — End: 2023-02-24

## 2023-02-09 MED ORDER — FUROSEMIDE 20 MG PO TABS: 20.0000 mg | ORAL_TABLET | Freq: Every day | ORAL | 0 refills | Status: DC

## 2023-02-09 NOTE — Telephone Encounter (Signed)
Please notify patient. Medications refilled (pain med + lasix)  He should continue to follow up with Ortho as planned. As I have discussed with him before, unfortunately I am not sure how to solve or treat his leg, it is an orthopedic issue. I am hopeful they can address his needs promptly. I will continue to manage the pain and symptoms with the fluid pill.  Saralyn Pilar, DO [facility] 02/09/2023, 12:41 PM

## 2023-02-09 NOTE — Addendum Note (Signed)
Addended by: [name] on: 02/09/2023 12:41 PM   Modules accepted: Orders

## 2023-02-09 NOTE — Telephone Encounter (Signed)
Chief Complaint: Leg pain  Symptoms: left leg pain, swelling, numbness, weakness Frequency: Constant, Chronic Pertinent Negatives: Patient denies fever, nausea,vomiting, falls Disposition: [] ED /[] Urgent Care (no appt availability in office) / [x] Appointment(In office/virtual)/ []  [facility]/ [] Home Care/ [] Refused Recommended Disposition /[] [facility]/ []  Follow-up with PCP Additional Notes: Patient states his left leg continues to have pain 6/10 on the pain scale right now. Patient also reports swelling in the left leg. Patient has an appointment with [facility] on 02/22/23. Patient just concerned the leg is not healing fast enough at this point. Patient also reports being low on pain medicine and requested a refill. Patient states he has about 10 pills of lasix left as well. Care advice given and patient was advised to call [facility] to see if he can be seen sooner than his scheduled appointment. Patient does not want to see PCP before Ortho visit.   Reason for Disposition  Leg pain or muscle cramp is a chronic symptom (recurrent or ongoing AND present > 4 weeks)  Answer Assessment - Initial Assessment Questions 1. ONSET: "When did the pain start?"      June 2024 2. LOCATION: "Where is the pain located?"      Left leg  3. PAIN: "How bad is the pain?"    (Scale 1-10; or mild, moderate, severe)   -  MILD (1-3): doesn't interfere with normal activities    -  MODERATE (4-7): interferes with normal activities (e.g., work or school) or awakens from sleep, limping    -  SEVERE (8-10): excruciating pain, unable to do any normal activities, unable to walk     6/10 4. WORK OR EXERCISE: "Has there been any recent work or exercise that involved this part of the body?"      I'm trying to walk on it more  5. CAUSE: "What do you think is causing the leg pain?"     I had a injury back in June of this year  6. OTHER SYMPTOMS: "Do you have any other symptoms?" (e.g., chest pain, back  pain, breathing difficulty, swelling, rash, fever, numbness, weakness)     Swelling, oozing wound, numbness,  Protocols used: Leg Pain-A-AH

## 2023-02-09 NOTE — Telephone Encounter (Signed)
Patient notified and he is appreciative. He is on a cancellation list for Ortho, but has an appointment on November 6. He is hopeful to get in sooner.

## 2023-02-09 NOTE — Telephone Encounter (Signed)
Medication Refill - Medication: Oxycodone HCl 10 MG TABS   Has the patient contacted their pharmacy? yes (Agent: If yes, when and what did the pharmacy advise?)contact pcp  Preferred Pharmacy (with phone number or street name): [id number] - [place], [place] - [address] Phone: [phone]  Fax: [phone]   Has the patient been seen for an appointment in the last year OR does the patient have an upcoming appointment? yes  Agent: Please be advised that RX refills may take up to 3 business days. We ask that you follow-up with your pharmacy.

## 2023-02-10 ENCOUNTER — Other Ambulatory Visit: Payer: Self-pay | Admitting: Obstetrics and Gynecology
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
## 2023-02-12 ENCOUNTER — Other Ambulatory Visit: Payer: Self-pay

## 2023-02-12 NOTE — Patient Outreach (Signed)
  Medicaid Managed Care   Unsuccessful Outreach Note  02/12/2023 Name: Michael [name] MRN: [id number] DOB: 1970/10/31  Referred by: [name], DO Reason for referral : High Risk Managed Medicaid (MM Social work telephone outreach )   An unsuccessful telephone outreach was attempted today. The patient was referred to the case management team for assistance with care management and care coordination.   Follow Up Plan: The care management team will reach out to the patient again over the next 30 days.   [name], MHA [facility]  Managed [facility] Social Worker [phone]

## 2023-02-12 NOTE — Patient Instructions (Signed)
  Medicaid Managed Care   Unsuccessful Outreach Note  02/12/2023 Name: Michael [name] MRN: [id number] DOB: 1970/10/31  Referred by: [name], DO Reason for referral : High Risk Managed Medicaid (MM Social work telephone outreach )   An unsuccessful telephone outreach was attempted today. The patient was referred to the case management team for assistance with care management and care coordination.   Follow Up Plan: The care management team will reach out to the patient again over the next 30 days.   [name], MHA [facility]  Managed [facility] Social Worker [phone]

## 2023-02-22 DIAGNOSIS — S82302D Unspecified fracture of lower end of left tibia, subsequent encounter for closed fracture with routine healing: Secondary | ICD-10-CM | POA: Diagnosis not present

## 2023-02-22 DIAGNOSIS — S82252E Displaced comminuted fracture of shaft of left tibia, subsequent encounter for open fracture type I or II with routine healing: Secondary | ICD-10-CM | POA: Diagnosis not present

## 2023-02-22 DIAGNOSIS — S42332D Displaced oblique fracture of shaft of humerus, left arm, subsequent encounter for fracture with routine healing: Secondary | ICD-10-CM | POA: Diagnosis not present

## 2023-02-22 DIAGNOSIS — S42302D Unspecified fracture of shaft of humerus, left arm, subsequent encounter for fracture with routine healing: Secondary | ICD-10-CM | POA: Diagnosis not present

## 2023-02-24 ENCOUNTER — Ambulatory Visit: Payer: Medicaid Other | Admitting: Family Medicine

## 2023-02-24 ENCOUNTER — Other Ambulatory Visit: Payer: Self-pay | Admitting: Family Medicine

## 2023-02-24 ENCOUNTER — Encounter: Payer: Self-pay | Admitting: Family Medicine

## 2023-02-24 VITALS — BP 124/80 | HR 85 | Ht 74.0 in | Wt 212.0 lb

## 2023-02-24 DIAGNOSIS — Z0000 Encounter for general adult medical examination without abnormal findings: Secondary | ICD-10-CM

## 2023-02-24 DIAGNOSIS — M79602 Pain in left arm: Secondary | ICD-10-CM

## 2023-02-24 DIAGNOSIS — M79605 Pain in left leg: Secondary | ICD-10-CM

## 2023-02-24 DIAGNOSIS — T07XXXA Unspecified multiple injuries, initial encounter: Secondary | ICD-10-CM

## 2023-02-24 DIAGNOSIS — Z13220 Encounter for screening for lipoid disorders: Secondary | ICD-10-CM

## 2023-02-24 DIAGNOSIS — G8929 Other chronic pain: Secondary | ICD-10-CM

## 2023-02-24 DIAGNOSIS — M7989 Other specified soft tissue disorders: Secondary | ICD-10-CM

## 2023-02-24 DIAGNOSIS — Z131 Encounter for screening for diabetes mellitus: Secondary | ICD-10-CM

## 2023-02-24 DIAGNOSIS — I83892 Varicose veins of left lower extremities with other complications: Secondary | ICD-10-CM

## 2023-02-24 DIAGNOSIS — Z125 Encounter for screening for malignant neoplasm of prostate: Secondary | ICD-10-CM

## 2023-02-24 MED ORDER — OXYCODONE HCL 10 MG PO TABS
10.0000 mg | ORAL_TABLET | ORAL | 0 refills | Status: DC | PRN
Start: 2023-02-24 — End: 2023-03-01

## 2023-02-24 MED ORDER — FUROSEMIDE 20 MG PO TABS: 20.0000 mg | ORAL_TABLET | Freq: Every day | ORAL | 5 refills | Status: DC | PRN

## 2023-02-24 MED ORDER — GABAPENTIN 300 MG PO CAPS
600.0000 mg | ORAL_CAPSULE | Freq: Three times a day (TID) | ORAL | 2 refills | Status: DC | PRN
Start: 2023-02-24 — End: 2023-05-14

## 2023-02-24 NOTE — Patient Instructions (Addendum)
Thank you for coming to the office today.  Refilled medications  Keep on Furosemide 20mg  - use the fluid pill more often, can use daily for period of time if need, or every other day.  Keep on the elevation and compression.  The swelling is not causing any damage or harm, it is just frustrating.  For pain, will refill Oxycodone   Continue methocarbamol  DUE for FASTING BLOOD WORK (no food or drink after midnight before the lab appointment, only water or coffee without cream/sugar on the morning of)  SCHEDULE "Lab Only" visit in the morning at the clinic for lab draw in 1  MONTHS   - Make sure Lab Only appointment is at about 1 week before your next appointment, so that results will be available  For Lab Results, once available within 2-3 days of blood draw, you can can log in to MyChart online to view your results and a brief explanation. Also, we can discuss results at next follow-up visit.    Please schedule a Follow-up Appointment to: Return in about 4 weeks (around 03/24/2023) for 3 weeks fasting lab only then 1 week later (about 4 weeks from today) Follow-up Lab results.  If you have any other questions or concerns, please feel free to call the office or send a message through MyChart. You may also schedule an earlier appointment if necessary.  Additionally, you may be receiving a survey about your experience at our office within a few days to 1 week by e-mail or mail. We value your feedback.  Saralyn Pilar, DO [facility], [place]
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
## 2023-03-01 ENCOUNTER — Other Ambulatory Visit: Payer: Self-pay | Admitting: Family Medicine

## 2023-03-01 ENCOUNTER — Telehealth: Payer: Self-pay

## 2023-03-01 DIAGNOSIS — T07XXXA Unspecified multiple injuries, initial encounter: Secondary | ICD-10-CM

## 2023-03-01 DIAGNOSIS — M79605 Pain in left leg: Secondary | ICD-10-CM

## 2023-03-01 MED ORDER — OXYCODONE HCL 10 MG PO TABS
10.0000 mg | ORAL_TABLET | ORAL | 0 refills | Status: DC | PRN
Start: 2023-03-01 — End: 2023-03-23

## 2023-03-01 NOTE — Addendum Note (Signed)
Addended by: [name] on: 03/01/2023 04:23 PM   Modules accepted: Orders

## 2023-03-01 NOTE — Telephone Encounter (Signed)
Medication Refill -  Most Recent Primary Care Visit:  Provider: [name]  Department: [facility]  Visit Type: OFFICE VISIT  Date: 02/24/2023  Medication: Oxycodone HCl 10 MG TABS [[id number]]   Has the patient contacted their pharmacy? Yes  (Agent: If yes, when and what did the pharmacy advise?) Contact office no prescription   Is this the correct pharmacy for this prescription? Yes If no, delete pharmacy and type the correct one.  This is the patient's preferred pharmacy:  [id number] - [place], [place] - [address] [place] [address] Phone: [phone] Fax: [phone]   Has the prescription been filled recently? Yes  Is the patient out of the medication? Yes  Has the patient been seen for an appointment in the last year OR does the patient have an upcoming appointment? Yes  Can we respond through MyChart? Yes  Agent: Please be advised that Rx refills may take up to 3 business days. We ask that you follow-up with your pharmacy.   Transmission to the pharmacy failed.

## 2023-03-01 NOTE — Telephone Encounter (Signed)
Pt needs the oxycodone called in or resent to pharmacy. Pt called from the pharmacy and the oxycodone was not ready. Transmission failed. Please resend.

## 2023-03-01 NOTE — Telephone Encounter (Signed)
Please notify patient.  I have re-submitted order Oxycodone to pharmacy just now.   E-Prescribing Status: Receipt confirmed by pharmacy (03/01/2023  4:22 PM EST)   He should check with pharmacy tonight for the refill.  Saralyn Pilar, DO [facility] 03/01/2023, 4:23 PM

## 2023-03-15 ENCOUNTER — Other Ambulatory Visit: Payer: Self-pay | Admitting: Obstetrics and Gynecology
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
## 2023-03-16 ENCOUNTER — Other Ambulatory Visit: Payer: Self-pay
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
## 2023-03-17 ENCOUNTER — Other Ambulatory Visit: Payer: Medicaid Other

## 2023-03-17 DIAGNOSIS — Z0000 Encounter for general adult medical examination without abnormal findings: Secondary | ICD-10-CM

## 2023-03-17 DIAGNOSIS — Z125 Encounter for screening for malignant neoplasm of prostate: Secondary | ICD-10-CM

## 2023-03-17 DIAGNOSIS — Z13220 Encounter for screening for lipoid disorders: Secondary | ICD-10-CM

## 2023-03-17 DIAGNOSIS — Z131 Encounter for screening for diabetes mellitus: Secondary | ICD-10-CM

## 2023-03-17 DIAGNOSIS — R351 Nocturia: Secondary | ICD-10-CM | POA: Diagnosis not present

## 2023-03-18 LAB — CBC WITH DIFFERENTIAL/PLATELET
Absolute Lymphocytes: 1776 {cells}/uL (ref 850–3900)
Absolute Monocytes: 510 {cells}/uL (ref 200–950)
Basophils Absolute: 30 {cells}/uL (ref 0–200)
Basophils Relative: 0.5 %
Eosinophils Absolute: 198 {cells}/uL (ref 15–500)
Eosinophils Relative: 3.3 %
HCT: 40.5 % (ref 38.5–50.0)
Hemoglobin: 13.7 g/dL (ref 13.2–17.1)
MCH: 30.4 pg (ref 27.0–33.0)
MCHC: 33.8 g/dL (ref 32.0–36.0)
MCV: 90 fL (ref 80.0–100.0)
MPV: 9.9 fL (ref 7.5–12.5)
Monocytes Relative: 8.5 %
Neutro Abs: 3486 {cells}/uL (ref 1500–7800)
Neutrophils Relative %: 58.1 %
Platelets: 283 10*3/uL (ref 140–400)
RBC: 4.5 10*6/uL (ref 4.20–5.80)
RDW: 14.2 % (ref 11.0–15.0)
Total Lymphocyte: 29.6 %
WBC: 6 10*3/uL (ref 3.8–10.8)

## 2023-03-18 LAB — COMPLETE METABOLIC PANEL WITH GFR
AG Ratio: 1.5 (calc) (ref 1.0–2.5)
ALT: 13 U/L (ref 9–46)
AST: 15 U/L (ref 10–35)
Albumin: 4.5 g/dL (ref 3.6–5.1)
Alkaline phosphatase (APISO): 107 U/L (ref 35–144)
BUN: 15 mg/dL (ref 7–25)
CO2: 26 mmol/L (ref 20–32)
Calcium: 9.7 mg/dL (ref 8.6–10.3)
Chloride: 104 mmol/L (ref 98–110)
Creat: 0.93 mg/dL (ref 0.70–1.30)
Globulin: 3 g/dL (ref 1.9–3.7)
Glucose, Bld: 107 mg/dL — ABNORMAL HIGH (ref 65–99)
Potassium: 3.9 mmol/L (ref 3.5–5.3)
Sodium: 140 mmol/L (ref 135–146)
Total Bilirubin: 0.9 mg/dL (ref 0.2–1.2)
Total Protein: 7.5 g/dL (ref 6.1–8.1)
eGFR: 98 mL/min/{1.73_m2} (ref >=60)

## 2023-03-18 LAB — LIPID PANEL
Cholesterol: 225 mg/dL — ABNORMAL HIGH (ref <200)
HDL: 65 mg/dL (ref >=40)
LDL Cholesterol (Calc): 144 mg/dL — ABNORMAL HIGH
Non-HDL Cholesterol (Calc): 160 mg/dL — ABNORMAL HIGH (ref <130)
Total CHOL/HDL Ratio: 3.5 (calc) (ref <5.0)
Triglycerides: 63 mg/dL (ref <150)

## 2023-03-18 LAB — HEMOGLOBIN A1C
Hgb A1c MFr Bld: 5.7 %{Hb} — ABNORMAL HIGH (ref <5.7)
Mean Plasma Glucose: 117 mg/dL
eAG (mmol/L): 6.5 mmol/L

## 2023-03-18 LAB — PSA: PSA: 0.84 ng/mL (ref <=4.00)

## 2023-03-23 ENCOUNTER — Encounter: Payer: Self-pay | Admitting: Family Medicine

## 2023-03-23 ENCOUNTER — Ambulatory Visit: Payer: Medicaid Other | Admitting: Family Medicine

## 2023-03-23 VITALS — BP 128/78 | Ht 74.0 in | Wt 207.0 lb

## 2023-03-23 DIAGNOSIS — Z0000 Encounter for general adult medical examination without abnormal findings: Secondary | ICD-10-CM | POA: Diagnosis not present

## 2023-03-23 DIAGNOSIS — E7800 Pure hypercholesterolemia, unspecified: Secondary | ICD-10-CM | POA: Diagnosis not present

## 2023-03-23 DIAGNOSIS — M79605 Pain in left leg: Secondary | ICD-10-CM | POA: Diagnosis not present

## 2023-03-23 DIAGNOSIS — R7309 Other abnormal glucose: Secondary | ICD-10-CM

## 2023-03-23 DIAGNOSIS — Z1211 Encounter for screening for malignant neoplasm of colon: Secondary | ICD-10-CM | POA: Diagnosis not present

## 2023-03-23 DIAGNOSIS — T07XXXA Unspecified multiple injuries, initial encounter: Secondary | ICD-10-CM

## 2023-03-23 DIAGNOSIS — M79602 Pain in left arm: Secondary | ICD-10-CM

## 2023-03-23 MED ORDER — OXYCODONE HCL 10 MG PO TABS
10.0000 mg | ORAL_TABLET | ORAL | 0 refills | Status: DC | PRN
Start: 2023-03-23 — End: 2023-04-08
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
## 2023-03-23 NOTE — Patient Instructions (Addendum)
Thank you for coming to the office today.  Recent Labs    03/17/23 0808  HGBA1C 5.7*   Lipid Panel     Component Value Date/Time   CHOL 225 (H) 03/17/2023 0808   TRIG 63 03/17/2023 0808   HDL 65 03/17/2023 0808   CHOLHDL 3.5 03/17/2023 0808   VLDL 12 02/01/2018 1025   LDLCALC 144 (H) 03/17/2023 0808     You have been referred for a Coronary Calcium Score Cardiac CT Scan. This is a screening test for patients aged 54-50+ with cardiovascular risk factors or who are healthy but would be interested in Cardiovascular Screening for heart disease. Even if there is a family history of heart disease, this imaging can be useful. Typically it can be done every 5+ years or at a different timeline we agree on  The scan will look at the chest and mainly focus on the heart and identify early signs of calcium build up or blockages within the heart arteries. It is not 100% accurate for identifying blockages or heart disease, but it is useful to help [place] predict who may have some early changes or be at risk in the future for a heart attack or cardiovascular problem.  The results are reviewed by a Cardiologist and they will document the results. It should become available on MyChart. Typically the results are divided into percentiles based on other patients of the same demographic and age. So it will compare your risk to others similar to you. If you have a higher score >99 or higher percentile >75%tile, it is recommended to consider Statin cholesterol therapy and or referral to Cardiologist. I will try to help explain your results and if we have questions we can contact the Cardiologist.  You will be contacted for scheduling. Usually it is done at any imaging facility through [facility], [facility] or [facility].  The cost is $99 flat fee total and it does not go through insurance, so no authorization is  required.  ----------------------------------------------------------------   Ordered the Cologuard (home kit) test for colon cancer screening. Stay tuned for further updates.  It will be shipped to you directly. If not received in 2-4 weeks, call us or the company.   If you send it back and no results are received in 2-4 weeks, call us or the company as well!   Colon Cancer Screening: - For all adults age 67+ routine colon cancer screening is highly recommended.     - Recent guidelines from American Cancer Society recommend starting age of 22 - Early detection of colon cancer is important, because often there are no warning signs or symptoms, also if found early usually it can be cured. Late stage is hard to treat.   - If Cologuard is NEGATIVE, then it is good for 3 years before next due - If Cologuard is POSITIVE, then it is strongly advised to get a Colonoscopy, which allows the GI doctor to locate the source of the cancer or polyp (even very early stage) and treat it by removing it. ------------------------- Follow instructions to collect sample, you may call the company for any help or questions, 24/7 telephone support at [phone].   Please schedule a Follow-up Appointment to: Return for 6 month PreDM A1c.  If you have any other questions or concerns, please feel free to call the office or send a message through MyChart. You may also schedule an earlier appointment if necessary.  Additionally, you may be receiving a survey  about your experience at our office within a few days to 1 week by e-mail or mail. We value your feedback.  Saralyn Pilar, DO [facility], [place]

## 2023-04-02 ENCOUNTER — Other Ambulatory Visit: Payer: Self-pay

## 2023-04-02 NOTE — Patient Instructions (Signed)
Visit Information  Mr. Aragon was given information about Medicaid Managed Care team care coordination services as a part of their [facility] Community Plan Medicaid benefit. [name] verbally consented to engagement with the [facility] Managed Care team.   If you are experiencing a medical emergency, please call 911 or report to your local emergency department or urgent care.   If you have a non-emergency medical problem during routine business hours, please contact your provider's office and ask to speak with a nurse.   For questions related to your [facility], please call: [phone] or visit the homepage here: [email or web address]  If you would like to schedule transportation through your [facility], please call the following number at least 2 days in advance of your appointment: [phone]   Rides for urgent appointments can also be made after hours by calling Member Services.  Call the Behavioral Health Crisis Line at [phone], at any time, 24 hours a day, 7 days a week. If you are in danger or need immediate medical attention call 911.  If you would like help to quit smoking, call 1-800-QUIT-NOW (316-227-7669) OR Espaol: 1-855-Djelo-Ya (0-272-536-6440) o para ms informacin haga clic aqu or Text READY to 347-425 to register via text  Mr. Peragine - following are the goals we discussed in your visit today:   Goals Addressed   None      The  Patient                                              has been provided with contact information for the Managed Medicaid care management team and has been advised to call with any health related questions or concerns.   [name], [name], MHA [facility]  Managed Medicaid Social Worker [phone]   Following is a copy of your plan of care:  There are no care plans that you recently modified to  display for this patient.
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
## 2023-04-08 ENCOUNTER — Other Ambulatory Visit: Payer: Self-pay | Admitting: Family Medicine

## 2023-04-08 DIAGNOSIS — M79605 Pain in left leg: Secondary | ICD-10-CM

## 2023-04-08 DIAGNOSIS — T07XXXA Unspecified multiple injuries, initial encounter: Secondary | ICD-10-CM

## 2023-04-08 MED ORDER — OXYCODONE HCL 10 MG PO TABS: 10.0000 mg | ORAL_TABLET | ORAL | 0 refills | Status: DC | PRN

## 2023-04-08 NOTE — Telephone Encounter (Signed)
[facility] called and spoke to Gabriel Rung, [facility] about the refill(s) gabapentin and oxycodone requested. Advised gabapentin was sent on 02/24/23 #180/2 refill(s). He says there are refills available for gabapentin and he can put that in to refill. Oxycodone was sent for 14 days if patient is taking ATC Q4H, so he will need a refill sent in.
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
## 2023-04-08 NOTE — Telephone Encounter (Signed)
Medication Refill -  Most Recent Primary Care Visit:  Provider: [name]  Department: [facility]  Visit Type: OFFICE VISIT  Date: 03/23/2023  Medication: Oxycodone HCl 10 MG TABS gabapentin (NEURONTIN) 300 MG capsule  Has the patient contacted their pharmacy? Yes (Agent: If yes, when and what did the pharmacy advise?) Contact office for more refills.  Is this the correct pharmacy for this prescription? Yes This is the patient's preferred pharmacy:  [id number] [place] [place] - [address] [place] [address] Phone: [phone] Fax: [phone]   Has the prescription been filled recently? Yes  Is the patient out of the medication? Yes, out of the gabapentin.   Has the patient been seen for an appointment in the last year OR does the patient have an upcoming appointment? Yes  Can we respond through MyChart? No  Agent: Please be advised that Rx refills may take up to 3 business days. We ask that you follow-up with your pharmacy.

## 2023-04-16 ENCOUNTER — Other Ambulatory Visit: Payer: Self-pay | Admitting: Obstetrics and Gynecology

## 2023-04-16 NOTE — Patient Outreach (Signed)
  Medicaid Managed Care   Unsuccessful Attempt Note   04/16/2023 Name: Michael [name] MRN: [id number] DOB: [birth year]  Referred by: [name], DO Reason for referral : High Risk Managed Medicaid (Unsuccessful telephone  outreach)  An unsuccessful telephone outreach was attempted today. The patient was referred to the case management team for assistance with care management and care coordination.    Follow Up Plan: The Managed Medicaid care management team will reach out to the patient again over the next 30 business  days. and The  Patient has been provided with contact information for the Managed Medicaid care management team and has been advised to call with any health related questions or concerns.   Kathi Der RN, BSN [place]  [employer], production - Managed Medicaid High Risk [phone]

## 2023-04-16 NOTE — Patient Instructions (Signed)
Hi Mr. Sarafin, I am sorry I missed you today, I hope all is well - as a part of your Medicaid benefit, you are eligible for care management and care coordination services at no cost or copay. I was unable to reach you by phone today but would be happy to help you with your health related needs. Please feel free to call me at [phone].  A member of the Managed Medicaid care management team will reach out to you again over the next 30 business  days.   Kathi Der RN, BSN [place]  [employer], production - Managed Medicaid High Risk [phone]

## 2023-04-22 ENCOUNTER — Other Ambulatory Visit: Payer: Self-pay | Admitting: Family Medicine

## 2023-04-22 DIAGNOSIS — M79602 Pain in left arm: Secondary | ICD-10-CM

## 2023-04-22 DIAGNOSIS — M79605 Pain in left leg: Secondary | ICD-10-CM

## 2023-04-22 DIAGNOSIS — T07XXXA Unspecified multiple injuries, initial encounter: Secondary | ICD-10-CM

## 2023-04-22 NOTE — Telephone Encounter (Signed)
 Medication Refill -  Most Recent Primary Care Visit:  Provider: EDMAN MARSA PARAS  Department: [facility]  Visit Type: OFFICE VISIT  Date: 03/23/2023  Medication: Oxycodone  HCl 10 MG TABS   methocarbamol  (ROBAXIN ) 500 MG tablet    Has the patient contacted their pharmacy? No   Is this the correct pharmacy for this prescription? Yes  This is the patient's preferred pharmacy:  [id number] - [place], [place] - [address] [place] [address] Phone: [phone] Fax: [phone]   Has the prescription been filled recently? Yes  Is the patient out of the medication? No  Has the patient been seen for an appointment in the last year OR does the patient have an upcoming appointment? Yes  Can we respond through MyChart? No  Agent: Please be advised that Rx refills may take up to 3 business days. We ask that you follow-up with your pharmacy.

## 2023-04-26 ENCOUNTER — Other Ambulatory Visit: Payer: Self-pay | Admitting: Family Medicine

## 2023-04-26 DIAGNOSIS — T07XXXA Unspecified multiple injuries, initial encounter: Secondary | ICD-10-CM

## 2023-04-26 DIAGNOSIS — M79602 Pain in left arm: Secondary | ICD-10-CM

## 2023-04-26 DIAGNOSIS — M79605 Pain in left leg: Secondary | ICD-10-CM

## 2023-04-26 MED ORDER — METHOCARBAMOL 500 MG PO TABS: 1000.0000 mg | ORAL_TABLET | Freq: Three times a day (TID) | ORAL | 2 refills | Status: DC | PRN

## 2023-04-26 MED ORDER — OXYCODONE HCL 10 MG PO TABS: 10.0000 mg | ORAL_TABLET | ORAL | 0 refills | Status: DC | PRN
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
## 2023-04-26 NOTE — Telephone Encounter (Signed)
 Pt called for status update on this request from April 22, 2023:   Medication Refill -  Most Recent Primary Care Visit:  Provider: EDMAN MARSA PARAS  Department: [facility]  Visit Type: OFFICE VISIT  Date: 03/23/2023  Medication: Oxycodone  HCl 10 MG TABS    methocarbamol  (ROBAXIN ) 500 MG tablet      Has the patient contacted their pharmacy? No     Is this the correct pharmacy for this prescription? Yes   This is the patient's preferred pharmacy:  [id number] [place], [place] - [address] [place] [address] Phone: [phone] Fax: [phone]     Has the prescription been filled recently? Yes   Is the patient out of the medication? No   Has the patient been seen for an appointment in the last year OR does the patient have an upcoming appointment? Yes  Can we respond through MyChart? No   Agent: Please be advised that Rx refills may take up to 3 business days. We ask that you follow-up with your pharmacy.

## 2023-04-28 ENCOUNTER — Other Ambulatory Visit: Payer: Self-pay | Admitting: Obstetrics and Gynecology
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
## 2023-04-28 NOTE — Patient Instructions (Signed)
 Hi Michael Michael [name] am glad things are gradually improving, have a nice afternoon!  Michael Michael [name] was given information about Medicaid Managed Care team care coordination services as a part of their [facility] Community Plan Medicaid benefit. Michael Michael [name] verbally consented to engagement with Michael Michael [name] Managed Care team.   If you are experiencing a medical emergency, please call 911 or report to your local emergency department or urgent care.   If you have a non-emergency medical problem during routine business hours, please contact your provider's office and ask to speak with a nurse.   For questions related to your [facility], please call: [phone] or visit Michael homepage here: [email or web address]  If you would like to schedule transportation through your [place] Michael [name], please call Michael following number at least 2 days in advance of your appointment: [phone]   Rides for urgent appointments can also be made after hours by calling Member Services.  Call Michael [facility] Crisis Line at [phone], at any time, 24 hours a day, 7 days a week. If you are in danger or need immediate medical attention call 911.  If you would like help to quit smoking, call 1-800-QUIT-NOW (778 687 1146) OR Espaol: 1-855-Djelo-Ya (8-144-664-6430) o para ms informacin haga clic aqu or Text READY to 799-599 to register via text  Michael Michael [name] - following are Michael goals we discussed in your visit today:   Goals Addressed    Timeframe:  Long-Range Goal Priority:  High Start Date:   12/07/22                          Expected End Date:    ongoing                   Follow Up Date 06/18/23   - schedule appointment for vaccines needed due to my age or health - schedule recommended health tests  - schedule and keep appointment for annual check-up    Why is this important?    Screening tests can find diseases early when they are easier to treat.  Your doctor or nurse will talk with you about which tests are important for you.  Getting shots for common diseases like Michael flu and shingles will help prevent them.   04/28/23:  cardiac study next week, ORTHO f/u in Feb?  Michael [name] verbalizes understanding of instructions and care plan provided today and agrees to view in MyChart. Active MyChart status and Michael [name] understanding of how to access instructions and care plan via MyChart confirmed with Michael [name].     Michael Managed Medicaid care management team will reach out to Michael Michael [name] again over Michael next 60 business  days.  Michael  Michael [name]   has been provided with contact information for Michael Managed Medicaid care management team and has been advised to call with any health related questions or concerns.   Michael Angles RN, BSN [place]  [facility] Care Management Coordinator - Managed Medicaid High Risk [phone]   Following is a copy of your plan of care:  Care Plan : RN Care Manager Plan of Care  Updates made by Michael [name] Michael MATSU, RN since 04/28/2023 12:00 AM     Problem: Health Promotion or Disease Self-Management (General Plan of Care)      Long-Range Goal: Chronic Disease Management   Start Date: 12/07/2022  Expected End Date: 07/27/2023  Priority: High  Note:   Current Barriers:  Knowledge Deficits related to plan of care for management of Osteoarthritis, - Midshaft left humerus fracture - Comminuted midshaft tibia fracture - Minimally displaced left anterior seventh rib fracture 04/28/23:  Leg pain and swelling gradually improving-taking Lasix  and pain med as needed. Not wearing support stockings right now.    RNCM Clinical Goal(s):  Michael [name] will verbalize understanding of plan for management of Osteoarthritis, Midshaft left humerus fracture - Comminuted midshaft tibia fracture - Minimally displaced left anterior seventh rib fracture  as evidenced by  Michael [name] report verbalize basic understanding of  Osteoarthritis, - Midshaft left humerus fracture - Comminuted midshaft tibia fracture - Minimally displaced left anterior seventh rib fracture  and self health management plan as evidenced by Michael [name] report take all medications exactly as prescribed and will call provider for medication related questions as evidenced by Michael [name] report demonstrate understanding of rationale for each prescribed medication as evidenced by Michael [name] report attend all scheduled medical appointments as evidenced by Michael [name] report Demonstrate ongoing  adherence to prescribed treatment plan for Osteoarthritis, - Midshaft left humerus fracture - Comminuted midshaft tibia fracture - Minimally displaced left anterior seventh rib fracture as evidenced by Michael [name] report and EMR review continue to work with RN Care Manager to address care management and care coordination needs related to Osteoarthritis, - Midshaft left humerus fracture - Comminuted midshaft tibia fracture - Minimally displaced left anterior seventh rib fracture   as evidenced by adherence to CM Team Scheduled appointments through collaboration with RN Care manager, provider, and care team.   Interventions: Inter-disciplinary care team collaboration (see longitudinal plan of care) Evaluation of current treatment plan related to Osteoarthritis, - Midshaft left humerus fracture - Comminuted midshaft tibia fracture - Minimally displaced left anterior seventh rib fracture  self management and Michael [name]'s adherence to plan as established by provider Collaborated with BSW.   BSW referral for resources BSW completed a telephone outreach with Michael [name], he states he did not receive Michael resources BSW sent him for disability assistance. BSW and Michael [name] agreed for resources to be resent via mail. No additional resources are needed at this time.     (Status:  New goal.)  Long Term Goal Evaluation of current treatment plan  related to  Osteoarthritis, - Midshaft left humerus fracture - Comminuted midshaft tibia fracture - Minimally displaced left anterior seventh rib fracture   self-management and Michael [name]'s adherence to plan as established by provider. Discussed plans with Michael [name] for ongoing care management follow up and provided Michael [name] with direct contact information for care management team Evaluation of current treatment plan related to  Osteoarthritis, - Midshaft left humerus fracture - Comminuted midshaft tibia fracture - Minimally displaced left anterior seventh rib fracture  and Michael [name]'s adherence to plan as established by provider Reviewed medications with Michael [name]  Reviewed scheduled/upcoming provider appointments  Discussed plans with Michael [name] for ongoing care management follow up and provided Michael [name] with direct contact information for care management team Assessed social determinant of health barriers  Michael [name] Goals/Self-Care Activities: Take all medications as prescribed Attend all scheduled provider appointments Call pharmacy for medication refills 3-7 days in advance of running out of medications Perform all self care activities independently  Perform IADL's (shopping, preparing meals, housekeeping, managing finances) independently Call provider office for new concerns or questions  01/11/23:  Michael [name] to f/u with providers/employer regarding return to work, disability.  Follow Up Plan:  Michael Michael [name] has been provided with contact information for Michael care management team and has been advised to call with any health related questions  or concerns.  Michael care management team will reach out to Michael Michael [name] again over Michael next 60 business  days.
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
## 2023-05-04 ENCOUNTER — Other Ambulatory Visit: Payer: Medicaid Other

## 2023-05-14 ENCOUNTER — Ambulatory Visit: Payer: Self-pay

## 2023-05-14 ENCOUNTER — Other Ambulatory Visit: Payer: Self-pay | Admitting: Family Medicine

## 2023-05-14 DIAGNOSIS — M79602 Pain in left arm: Secondary | ICD-10-CM

## 2023-05-14 DIAGNOSIS — T07XXXA Unspecified multiple injuries, initial encounter: Secondary | ICD-10-CM

## 2023-05-14 DIAGNOSIS — M7989 Other specified soft tissue disorders: Secondary | ICD-10-CM

## 2023-05-14 DIAGNOSIS — M79605 Pain in left leg: Secondary | ICD-10-CM

## 2023-05-14 MED ORDER — OXYCODONE HCL 10 MG PO TABS
10.0000 mg | ORAL_TABLET | ORAL | 0 refills | Status: DC | PRN
Start: 2023-05-14 — End: 2023-06-16

## 2023-05-14 MED ORDER — GABAPENTIN 300 MG PO CAPS: 600.0000 mg | ORAL_CAPSULE | Freq: Three times a day (TID) | ORAL | 3 refills | Status: DC | PRN
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
## 2023-05-14 NOTE — Telephone Encounter (Signed)
Tried calling patient. No answer no vm
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
## 2023-05-14 NOTE — Telephone Encounter (Signed)
Medication Refill -  Most Recent Primary Care Visit:  Provider: [name]  Department: [facility]  Visit Type: OFFICE VISIT  Date: 03/23/2023  Medication: gabapentin (NEURONTIN) 300 MG capsule [[id number]]   Oxycodone HCl 10 MG TABS [[id number]]   furosemide (LASIX) 20 MG tablet [[id number]]    Has the patient contacted their pharmacy? Yes (Agent: If no, request that the patient contact the pharmacy for the refill. If patient does not wish to contact the pharmacy document the reason why and proceed with request.) (Agent: If yes, when and what did the pharmacy advise?)  Is this the correct pharmacy for this prescription? Yes If no, delete pharmacy and type the correct one.  This is the patient's preferred pharmacy:  [id number] [place] - [address] [place] [address] Phone: [phone] Fax: [phone]   Has the prescription been filled recently? Yes  Is the patient out of the medication? Yes  Has the patient been seen for an appointment in the last year OR does the patient have an upcoming appointment? Yes  Can we respond through MyChart? Yes  Agent: Please be advised that Rx refills may take up to 3 business days. We ask that you follow-up with your pharmacy.
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
## 2023-05-15 ENCOUNTER — Other Ambulatory Visit: Payer: Self-pay | Admitting: Family Medicine

## 2023-05-15 DIAGNOSIS — M7989 Other specified soft tissue disorders: Secondary | ICD-10-CM
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
## 2023-05-24 DIAGNOSIS — S42302D Unspecified fracture of shaft of humerus, left arm, subsequent encounter for fracture with routine healing: Secondary | ICD-10-CM | POA: Diagnosis not present

## 2023-05-24 DIAGNOSIS — S82252E Displaced comminuted fracture of shaft of left tibia, subsequent encounter for open fracture type I or II with routine healing: Secondary | ICD-10-CM | POA: Diagnosis not present

## 2023-05-24 DIAGNOSIS — S42202D Unspecified fracture of upper end of left humerus, subsequent encounter for fracture with routine healing: Secondary | ICD-10-CM | POA: Diagnosis not present

## 2023-05-24 DIAGNOSIS — S82202D Unspecified fracture of shaft of left tibia, subsequent encounter for closed fracture with routine healing: Secondary | ICD-10-CM | POA: Diagnosis not present

## 2023-06-16 ENCOUNTER — Other Ambulatory Visit: Payer: Self-pay | Admitting: Family Medicine

## 2023-06-16 DIAGNOSIS — M79605 Pain in left leg: Secondary | ICD-10-CM

## 2023-06-16 DIAGNOSIS — M79602 Pain in left arm: Secondary | ICD-10-CM

## 2023-06-16 DIAGNOSIS — T07XXXA Unspecified multiple injuries, initial encounter: Secondary | ICD-10-CM

## 2023-06-16 MED ORDER — GABAPENTIN 300 MG PO CAPS: 600.0000 mg | ORAL_CAPSULE | Freq: Three times a day (TID) | ORAL | 3 refills | Status: DC | PRN

## 2023-06-16 MED ORDER — METHOCARBAMOL 500 MG PO TABS: 1000.0000 mg | ORAL_TABLET | Freq: Three times a day (TID) | ORAL | 2 refills | Status: DC | PRN

## 2023-06-16 MED ORDER — OXYCODONE HCL 10 MG PO TABS: 10.0000 mg | ORAL_TABLET | ORAL | 0 refills | Status: DC | PRN

## 2023-06-16 NOTE — Telephone Encounter (Signed)
 Copied from CRM [id number]. Topic: Clinical - Medication Refill >> Jun 16, 2023  2:04 PM Patsy Lager T wrote: Most Recent Primary Care Visit:  Provider: [name]  Department: [facility]  Visit Type: OFFICE VISIT  Date: 03/23/2023  Medication: gabapentin (NEURONTIN) 300 MG capsule, methocarbamol (ROBAXIN) 500 MG tablet and Oxycodone HCl 10 MG TABS  Has the patient contacted their pharmacy? Yes  Is this the correct pharmacy for this prescription? Yes  This is the patient's preferred pharmacy:  [id number] [place] [place] - [address] [place] [address] Phone: [phone] Fax: [phone]  Has the prescription been filled recently? Yes  Is the patient out of the medication? Yes  Has the patient been seen for an appointment in the last year OR does the patient have an upcoming appointment? Yes  Can we respond through MyChart? Yes  Agent: Please be advised that Rx refills may take up to 3 business days. We ask that you follow-up with your pharmacy.

## 2023-06-18 ENCOUNTER — Other Ambulatory Visit: Payer: Self-pay | Admitting: Obstetrics and Gynecology

## 2023-06-18 NOTE — Patient Instructions (Signed)
 Visit Information  Mr. [name]  - as a part of your Medicaid benefit, you are eligible for care management and care coordination services at no cost or copay. I was unable to reach you by phone today but would be happy to help you with your health related needs. Please feel free to call me at [phone].  A member of the Managed Medicaid care management team will reach out to you again over the next 30 business  days.   Kathi Der RN, BSN, [employer] [facility] RN Care Manager Direct Dial [phone]/OVF [phone] Website: [email or web address]

## 2023-06-18 NOTE — Patient Outreach (Signed)
  Medicaid Managed Care   Unsuccessful Attempt Note   06/18/2023 Name: Michael [name] MRN: [id number] DOB: 09-Feb-1971  Referred by: [name], DO Reason for referral : High Risk Managed Medicaid (Unsuccessful telephone outreach)  An unsuccessful telephone outreach was attempted today. The patient was referred to the case management team for assistance with care management and care coordination.    Follow Up Plan: The Managed Medicaid care management team will reach out to the patient again over the next 30 business  days. and The  Patient has been provided with contact information for the Managed Medicaid care management team and has been advised to call with any health related questions or concerns.   Kathi Der RN, BSN, [employer] [facility] RN Care Manager Direct Dial [phone]/WGN [phone] Website: [email or web address]

## 2023-06-29 ENCOUNTER — Other Ambulatory Visit: Payer: Self-pay | Admitting: Obstetrics and Gynecology

## 2023-06-29 NOTE — Patient Instructions (Signed)
 Visit Information  [name]  - as a part of your Medicaid benefit, you are eligible for care management and care coordination services at no cost or copay. I was unable to reach you by phone today but would be happy to help you with your health related needs. Please feel free to call me at [phone].  A member of the Managed Medicaid care management team will reach out to you again over the next 30 business days.   Kathi Der RNC, BSN, [employer] [facility] RN Care Manager Direct Dial [phone] Website: [email or web address]

## 2023-06-29 NOTE — Patient Outreach (Signed)
  Medicaid Managed Care   Unsuccessful Attempt Note   06/29/2023 Name: Michael [name] MRN: [id number] DOB: [birth year]  Referred by: [name], DO Reason for referral : High Risk Managed Medicaid (Unsuccessful telephone outreach)  A second unsuccessful telephone outreach was attempted today. The patient was referred to the case management team for assistance with care management and care coordination.    Follow Up Plan: The Managed Medicaid care management team will reach out to the patient again over the next 30 business  days. and The  Patient has been provided with contact information for the Managed Medicaid care management team and has been advised to call with any health related questions or concerns.   Kathi Der RNC, BSN, [employer] [facility] RN Care Manager Direct Dial [phone] Website: [email or web address]

## 2023-07-06 ENCOUNTER — Other Ambulatory Visit: Payer: Self-pay | Admitting: Obstetrics and Gynecology
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
## 2023-07-06 NOTE — Patient Instructions (Signed)
 Visit Information  Michael [name] was given information about Medicaid Managed Care team care coordination services as a part of their [facility] Plan Medicaid benefit. Michael [name] verbally consented to engagement with the [facility] Managed Care team.   If you are experiencing a medical emergency, please call 911 or report to your local emergency department or urgent care.   If you have a non-emergency medical problem during routine business hours, please contact your provider's office and ask to speak with a nurse.   For questions related to your [facility], please call: [phone] or visit the homepage here: [email or web address]  If you would like to schedule transportation through your [facility], please call the following number at least 2 days in advance of your appointment: [phone]   Rides for urgent appointments can also be made after hours by calling Member Services.  Call the Behavioral Health Crisis Line at [phone], at any time, 24 hours a day, 7 days a week. If you are in danger or need immediate medical attention call 911.  If you would like help to quit smoking, call 1-800-QUIT-NOW ((502)297-5437) OR Espaol: 1-855-Djelo-Ya (0-102-725-3664) o para ms informacin haga clic aqu or Text READY to 403-474 to register via text  Michael [name] - following are the goals we discussed in your visit today:   Goals Addressed    Timeframe:  Long-Range Goal Priority:  High Start Date:   12/07/22                          Expected End Date:    ongoing                   Follow Up Date 08/20/23   - schedule appointment for vaccines needed due to my age or health - schedule recommended health tests  - schedule and keep appointment for annual check-up    Why is this important?   Screening tests can find diseases early when they are easier to treat.   Your doctor or nurse will talk with you about which tests are important for you.  Getting shots for common diseases like the flu and shingles will help prevent them.   07/06/23:  patient to f/u on PT and cardiac study.  Seen by [facility] in Feb.  Patient verbalizes understanding of instructions and care plan provided today and agrees to view in MyChart. Active MyChart status and patient understanding of how to access instructions and care plan via MyChart confirmed with patient.     The Managed Medicaid care management team will reach out to the patient again over the next 60 business  days.  The  Patient   has been provided with contact information for the Managed Medicaid care management team and has been advised to call with any health related questions or concerns.   Kathi Der RNC, BSN, [employer] [facility] RN Care Manager Direct Dial [phone] Website: [email or web address]    Following is a copy of your plan of care:  Care Plan : RN Care Manager Plan of Care  Updates made by [name], RN since 07/06/2023 12:00 AM     Problem: Health Promotion or Disease Self-Management (General Plan of Care)      Long-Range Goal: Chronic Disease Management   Start Date: 12/07/2022  Expected End Date: 10/06/2023  Priority: High  Note:   Current Barriers:  Knowledge Deficits related to  plan of care for management of Osteoarthritis, - Midshaft left humerus fracture - Comminuted midshaft tibia fracture - Minimally displaced left anterior seventh rib fracture 07/06/23: States leg is doing better-some redness and swelling when up on it a long time-to f/u with ORTHO.  Also, to f/u on PT referral and cardiac CT.     RNCM Clinical Goal(s):  Patient will verbalize understanding of plan for management of Osteoarthritis, Midshaft left humerus fracture - Comminuted midshaft tibia fracture - Minimally displaced left anterior seventh rib fracture  as evidenced by  patient report verbalize basic understanding of  Osteoarthritis, - Midshaft left humerus fracture - Comminuted midshaft tibia fracture - Minimally displaced left anterior seventh rib fracture  and self health management plan as evidenced by patient report take all medications exactly as prescribed and will call provider for medication related questions as evidenced by patient report demonstrate understanding of rationale for each prescribed medication as evidenced by patient report attend all scheduled medical appointments as evidenced by patient report Demonstrate ongoing  adherence to prescribed treatment plan for Osteoarthritis, - Midshaft left humerus fracture - Comminuted midshaft tibia fracture - Minimally displaced left anterior seventh rib fracture as evidenced by patient report and EMR review continue to work with RN Care Manager to address care management and care coordination needs related to Osteoarthritis, - Midshaft left humerus fracture - Comminuted midshaft tibia fracture - Minimally displaced left anterior seventh rib fracture   as evidenced by adherence to CM Team Scheduled appointments through collaboration with RN Care manager, provider, and care team.   Interventions: Inter-disciplinary care team collaboration (see longitudinal plan of care) Evaluation of current treatment plan related to Osteoarthritis, - Midshaft left humerus fracture - Comminuted midshaft tibia fracture - Minimally displaced left anterior seventh rib fracture  self management and patient's adherence to plan as established by provider Collaborated with BSW.   BSW referral for resources BSW completed a telephone outreach with patient, he states he did not receive the resources BSW sent him for disability assistance. BSW and patient agreed for resources to be resent via mail. No additional resources are needed at this time.     (Status:  New goal.)  Long Term Goal Evaluation of current treatment plan  related to  Osteoarthritis, - Midshaft left humerus fracture - Comminuted midshaft tibia fracture - Minimally displaced left anterior seventh rib fracture   self-management and patient's adherence to plan as established by provider. Discussed plans with patient for ongoing care management follow up and provided patient with direct contact information for care management team Evaluation of current treatment plan related to  Osteoarthritis, - Midshaft left humerus fracture - Comminuted midshaft tibia fracture - Minimally displaced left anterior seventh rib fracture  and patient's adherence to plan as established by provider Reviewed medications with patient  Reviewed scheduled/upcoming provider appointments  Discussed plans with patient for ongoing care management follow up and provided patient with direct contact information for care management team Assessed social determinant of health barriers  Patient Goals/Self-Care Activities: Take all medications as prescribed Attend all scheduled provider appointments Call pharmacy for medication refills 3-7 days in advance of running out of medications Perform all self care activities independently  Perform IADL's (shopping, preparing meals, housekeeping, managing finances) independently Call provider office for new concerns or questions  01/11/23:  patient to f/u with providers/employer regarding return to work, disability. 07/06/23: patient to f/u on PT referral and Cardiac CT  Follow Up Plan:  The patient has been provided with contact information  for the care management team and has been advised to call with any health related questions or concerns.  The care management team will reach out to the patient again over the next 60 business  days.

## 2023-07-13 ENCOUNTER — Other Ambulatory Visit: Payer: Self-pay | Admitting: Family Medicine

## 2023-07-13 DIAGNOSIS — T07XXXA Unspecified multiple injuries, initial encounter: Secondary | ICD-10-CM

## 2023-07-13 DIAGNOSIS — M79602 Pain in left arm: Secondary | ICD-10-CM

## 2023-07-13 DIAGNOSIS — M79605 Pain in left leg: Secondary | ICD-10-CM

## 2023-07-13 NOTE — Telephone Encounter (Signed)
 Copied from CRM [id number]. Topic: Clinical - Medication Refill >> Jul 13, 2023  2:59 PM Gildardo Pounds wrote: Most Recent Primary Care Visit:  Provider: [name]  Department: [id number]  Visit Type: OFFICE VISIT  Date: 03/23/2023  Medication: Oxycodone HCl 10 MG TABS methocarbamol (ROBAXIN) 500 MG tablet  Has the patient contacted their pharmacy? Yes (Agent: If no, request that the patient contact the pharmacy for the refill. If patient does not wish to contact the pharmacy document the reason why and proceed with request.) (Agent: If yes, when and what did the pharmacy advise?)  Is this the correct pharmacy for this prescription? Yes If no, delete pharmacy and type the correct one.  This is the patient's preferred pharmacy:  [id number] [place], [place] - [address] [place] [address] Phone: [phone] Fax: [phone]   Has the prescription been filled recently? No  Is the patient out of the medication? No  Has the patient been seen for an appointment in the last year OR does the patient have an upcoming appointment? Yes  Can we respond through MyChart? Yes  Agent: Please be advised that Rx refills may take up to 3 business days. We ask that you follow-up with your pharmacy.

## 2023-07-15 MED ORDER — METHOCARBAMOL 500 MG PO TABS: 1000.0000 mg | ORAL_TABLET | Freq: Three times a day (TID) | ORAL | 2 refills | Status: AC | PRN

## 2023-07-15 MED ORDER — OXYCODONE HCL 10 MG PO TABS: 10.0000 mg | ORAL_TABLET | ORAL | 0 refills | Status: DC | PRN

## 2023-07-15 NOTE — Telephone Encounter (Signed)
 Requested medication (s) are due for refill today: yes for the oxycodone// NO for Methocarbamol  Requested medication (s) are on the active medication list: yes  Last refill:  both meds ordered 06/16/23 Oxy for 84 tabs and Metho. For #180 2 RF  Future visit scheduled: Yes  Notes to clinic:  Protocols will not attach. Medications not delegated to NT to Reorder   Requested Prescriptions  Pending Prescriptions Disp Refills   Oxycodone HCl 10 MG TABS 84 tablet 0    Sig: Take 1 tablet (10 mg total) by mouth every 4 (four) hours as needed (severe pain).     There is no refill protocol information for this order     methocarbamol (ROBAXIN) 500 MG tablet 180 tablet 2    Sig: Take 2 tablets (1,000 mg total) by mouth every 8 (eight) hours as needed for muscle spasms.     There is no refill protocol information for this order

## 2023-08-20 ENCOUNTER — Ambulatory Visit

## 2023-09-03 ENCOUNTER — Telehealth: Payer: Self-pay

## 2023-09-03 ENCOUNTER — Telehealth: Payer: Self-pay | Admitting: Family Medicine

## 2023-09-03 DIAGNOSIS — T07XXXA Unspecified multiple injuries, initial encounter: Secondary | ICD-10-CM

## 2023-09-03 DIAGNOSIS — M79605 Pain in left leg: Secondary | ICD-10-CM

## 2023-09-03 MED ORDER — OXYCODONE HCL 10 MG PO TABS: 10.0000 mg | ORAL_TABLET | ORAL | 0 refills | Status: DC | PRN

## 2023-09-03 NOTE — Telephone Encounter (Signed)
 Copied from CRM [id number]. Topic: Clinical - Medication Question >> Sep 03, 2023  2:59 PM Lotus Round B wrote: Reason for CRM: pt called in to see if he can get a prescription refill for the Oxycodone  HCl 10 MG TABS . He said he is trying to lean himself of it but its been alittle hard because his foot is still swollen .

## 2023-09-03 NOTE — Telephone Encounter (Signed)
 Pharm[facility] called and [facility]. Per pharmacy tech, 2 refills are still available on gabapentin . Pharmacy tech states she will get his prescription ready for him.   Patient called and updated him on refill status. Told patient that pharmacy is in the process of getting his prescription ready and they will call him when its ready.

## 2023-09-03 NOTE — Telephone Encounter (Signed)
 Copied from CRM [id number]. Topic: Clinical - Medication Refill >> Sep 03, 2023  2:57 PM Lotus Round B wrote: Medication: gabapentin  (NEURONTIN ) 300 MG capsule  Has the patient contacted their pharmacy? Yes (Agent: If no, request that the patient contact the pharmacy for the refill. If patient does not wish to contact the pharmacy document the reason why and proceed with request.) (Agent: If yes, when and what did the pharmacy advise?)  This is the patient's preferred pharmacy:  [id number] [place], [place] - [address] [place] [address] Phone: [phone] Fax: [phone]  Is this the correct pharmacy for this prescription? Yes If no, delete pharmacy and type the correct one.   Has the prescription been filled recently? No  Is the patient out of the medication? No  Has the patient been seen for an appointment in the last year OR does the patient have an upcoming appointment? Yes  Can we respond through MyChart? Yes  Agent: Please be advised that Rx refills may take up to 3 business days. We ask that you follow-up with your pharmacy.

## 2023-09-03 NOTE — Telephone Encounter (Signed)
 Refilled Oxycodone . He will pick up Gabapentin  at pharmacy, already has refills  Domingo Friend, DO [facility] 09/03/2023, 4:34 PM

## 2023-09-21 ENCOUNTER — Encounter: Payer: Self-pay | Admitting: Family Medicine

## 2023-09-21 ENCOUNTER — Other Ambulatory Visit: Payer: Self-pay | Admitting: Family Medicine

## 2023-09-21 ENCOUNTER — Ambulatory Visit: Payer: Self-pay | Admitting: Family Medicine

## 2023-09-21 VITALS — BP 138/88 | HR 100 | Ht 74.0 in | Wt 216.2 lb

## 2023-09-21 DIAGNOSIS — R7309 Other abnormal glucose: Secondary | ICD-10-CM

## 2023-09-21 DIAGNOSIS — M79602 Pain in left arm: Secondary | ICD-10-CM | POA: Diagnosis not present

## 2023-09-21 DIAGNOSIS — T07XXXA Unspecified multiple injuries, initial encounter: Secondary | ICD-10-CM

## 2023-09-21 DIAGNOSIS — Z0000 Encounter for general adult medical examination without abnormal findings: Secondary | ICD-10-CM

## 2023-09-21 DIAGNOSIS — E7800 Pure hypercholesterolemia, unspecified: Secondary | ICD-10-CM

## 2023-09-21 DIAGNOSIS — Z125 Encounter for screening for malignant neoplasm of prostate: Secondary | ICD-10-CM

## 2023-09-21 DIAGNOSIS — M7989 Other specified soft tissue disorders: Secondary | ICD-10-CM | POA: Diagnosis not present

## 2023-09-21 DIAGNOSIS — M79605 Pain in left leg: Secondary | ICD-10-CM

## 2023-09-21 DIAGNOSIS — I83892 Varicose veins of left lower extremities with other complications: Secondary | ICD-10-CM | POA: Diagnosis not present

## 2023-09-21 DIAGNOSIS — I872 Venous insufficiency (chronic) (peripheral): Secondary | ICD-10-CM | POA: Diagnosis not present

## 2023-09-21 LAB — POCT GLYCOSYLATED HEMOGLOBIN (HGB A1C): Hemoglobin A1C: 5.5 % (ref 4.0–5.6)

## 2023-09-21 MED ORDER — OXYCODONE HCL 10 MG PO TABS: 10.0000 mg | ORAL_TABLET | ORAL | 0 refills | Status: DC | PRN

## 2023-09-21 MED ORDER — GABAPENTIN 300 MG PO CAPS: 600.0000 mg | ORAL_CAPSULE | Freq: Three times a day (TID) | ORAL | 3 refills | Status: DC | PRN

## 2023-09-21 NOTE — Patient Instructions (Addendum)
 Thank you for coming to the office today.  Referral to Vascular specialist for Venous Stasis Dermatitis / Oozing and Swelling  [facility] [address] [place],  [place]  [address] Main: [phone]  Okay to take more Lasix  fluid pill as needed, can double dose if significant swelling, usually 2-3 days or more at a time, keep it short term.  Use RICE therapy: - R - Rest / relative rest with activity modification avoid overuse of joint - I - Ice packs (make sure you use a towel or sock / something to protect skin) - C - Compression with flexible Knee Sleeve / ACE wrap to apply pressure and reduce swelling allowing more support - E - Elevation - if significant swelling, lift leg above heart level (toes above your nose) to help reduce swelling, most helpful at night after day of being on your feet  Refilled Gabapentin , Oxycodone   DUE for FASTING BLOOD WORK (no food or drink after midnight before the lab appointment, only water or coffee without cream/sugar on the morning of)  SCHEDULE "Lab Only" visit in the morning at the clinic for lab draw in 6 MONTHS   - Make sure Lab Only appointment is at about 1 week before your next appointment, so that results will be available  For Lab Results, once available within 2-3 days of blood draw, you can can log in to MyChart online to view your results and a brief explanation. Also, we can discuss results at next follow-up visit.   Please schedule a Follow-up Appointment to: Return for 6 month fasting lab > 1 week later Annual Physical.  If you have any other questions or concerns, please feel free to call the office or send a message through MyChart. You may also schedule an earlier appointment if necessary.  Additionally, you may be receiving a survey about your experience at our office within a few days to 1 week by e-mail or mail. We value your feedback.  Domingo Friend, DO [facility] [place], [place]
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
## 2023-10-07 ENCOUNTER — Telehealth: Payer: Self-pay

## 2023-10-07 ENCOUNTER — Other Ambulatory Visit: Payer: Self-pay
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
## 2023-10-07 NOTE — Telephone Encounter (Signed)
 Called patient to clarify. He wants to return tomorrow on Fri 6/20. Light duty, does not need any other restrictions per his work.  I have written the letter, it is valid for 60 days, on light duty, then return to full duty or we can extend light duty if needed. It is on his mychart he wil show it to his employer and pick up copy here when he is able to. I will sign and print and have it ready up front.  Domingo Friend, DO [facility] 10/07/2023, 5:35 PM

## 2023-10-07 NOTE — Telephone Encounter (Signed)
 Patient is requesting a return to work letter to be on light duty.
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
## 2023-10-08 NOTE — Telephone Encounter (Signed)
 Patient is calling to report that he has having diffuculty into his mychart. Requesting if the work note can be fax to his work- [employer]. [phone]- fax Attention [employer]

## 2023-10-19 ENCOUNTER — Other Ambulatory Visit: Payer: Self-pay | Admitting: Family Medicine

## 2023-10-19 DIAGNOSIS — M79602 Pain in left arm: Secondary | ICD-10-CM

## 2023-10-19 DIAGNOSIS — M79605 Pain in left leg: Secondary | ICD-10-CM

## 2023-10-19 DIAGNOSIS — T07XXXA Unspecified multiple injuries, initial encounter: Secondary | ICD-10-CM

## 2023-10-19 DIAGNOSIS — M7989 Other specified soft tissue disorders: Secondary | ICD-10-CM

## 2023-10-19 NOTE — Telephone Encounter (Unsigned)
 Copied from CRM [id number]. Topic: Clinical - Medication Refill >> Oct 19, 2023  2:40 PM Powell HERO wrote: Medication: gabapentin  (NEURONTIN ) 300 MG capsule, Oxycodone  HCl 10 MG TABS, furosemide  (LASIX ) 20 MG tablet  Has the patient contacted their pharmacy? Yes No refills  This is the patient's preferred pharmacy:  [id number] - [place], [place] - [address] [place] [address] Phone: [phone] Fax: [phone]  Is this the correct pharmacy for this prescription? Yes If no, delete pharmacy and type the correct one.   Has the prescription been filled recently? No  Is the patient out of the medication? No, a couple of gabpentin and a couple pain pills left. About to go on vacation.  Has the patient been seen for an appointment in the last year OR does the patient have an upcoming appointment? Yes  Can we respond through MyChart? Yes  Agent: Please be advised that Rx refills may take up to 3 business days. We ask that you follow-up with your pharmacy.

## 2023-10-21 ENCOUNTER — Other Ambulatory Visit (INDEPENDENT_AMBULATORY_CARE_PROVIDER_SITE_OTHER): Payer: Self-pay | Admitting: Nurse Practitioner

## 2023-10-21 DIAGNOSIS — I83892 Varicose veins of left lower extremities with other complications: Secondary | ICD-10-CM

## 2023-10-21 MED ORDER — OXYCODONE HCL 10 MG PO TABS: 10.0000 mg | ORAL_TABLET | ORAL | 0 refills | Status: DC | PRN

## 2023-10-21 MED ORDER — FUROSEMIDE 20 MG PO TABS: 20.0000 mg | ORAL_TABLET | Freq: Every day | ORAL | 5 refills | Status: DC

## 2023-10-21 NOTE — Telephone Encounter (Signed)
 Requested Prescriptions  Pending Prescriptions Disp Refills   furosemide  (LASIX ) 20 MG tablet 30 tablet 5     Cardiovascular:  Diuretics - Loop Failed - 10/21/2023  1:43 PM      Failed - K in normal range and within 180 days    Potassium  Date Value Ref Range Status  03/17/2023 3.9 3.5 - 5.3 mmol/L Final         Failed - Ca in normal range and within 180 days    Calcium  Date Value Ref Range Status  03/17/2023 9.7 8.6 - 10.3 mg/dL Final         Failed - Na in normal range and within 180 days    Sodium  Date Value Ref Range Status  03/17/2023 140 135 - 146 mmol/L Final         Failed - Cr in normal range and within 180 days    Creat  Date Value Ref Range Status  03/17/2023 0.93 0.70 - 1.30 mg/dL Final         Failed - Cl in normal range and within 180 days    Chloride  Date Value Ref Range Status  03/17/2023 104 98 - 110 mmol/L Final         Failed - Mg Level in normal range and within 180 days    No results found for: MG       Passed - Last BP in normal range    BP Readings from Last 1 Encounters:  09/21/23 138/88         Passed - Valid encounter within last 6 months    Recent Outpatient Visits           1 month ago Abnormal glucose   [place] [facility] [place]man Marsa PARAS, DO               Oxycodone  HCl 10 MG TABS 84 tablet 0    Sig: Take 1 tablet (10 mg total) by mouth every 4 (four) hours as needed (severe pain).     Not Delegated - Analgesics:  Opioid Agonists Failed - 10/21/2023  1:43 PM      Failed - This refill cannot be delegated      Failed - Urine Drug Screen completed in last 360 days      Passed - Valid encounter within last 3 months    Recent Outpatient Visits           1 month ago Abnormal glucose   [place] [facility] [place], Marsa PARAS, DO              Refused Prescriptions Disp Refills   gabapentin  (NEURONTIN ) 300 MG capsule 180 capsule 3    Sig: Take 2 capsules (600 mg  total) by mouth 3 (three) times daily as needed (pain).     Neurology: Anticonvulsants - gabapentin  Passed - 10/21/2023  1:43 PM      Passed - Cr in normal range and within 360 days    Creat  Date Value Ref Range Status  03/17/2023 0.93 0.70 - 1.30 mg/dL Final         Passed - Completed PHQ-2 or PHQ-9 in the last 360 days      Passed - Valid encounter within last 12 months    Recent Outpatient Visits           1 month ago Abnormal glucose   [place] [facility] [place], Marsa PARAS,  DO

## 2023-10-21 NOTE — Telephone Encounter (Signed)
 Requested medications are due for refill today.  yes  Requested medications are on the active medications list.  yes  Last refill. varied  Future visit scheduled.   In December  Notes to clinic.  Please review lasix  sig. Oxycodone  not delegated.    Requested Prescriptions  Pending Prescriptions Disp Refills   furosemide  (LASIX ) 20 MG tablet 30 tablet 5     Cardiovascular:  Diuretics - Loop Failed - 10/21/2023  1:43 PM      Failed - K in normal range and within 180 days    Potassium  Date Value Ref Range Status  03/17/2023 3.9 3.5 - 5.3 mmol/L Final         Failed - Ca in normal range and within 180 days    Calcium  Date Value Ref Range Status  03/17/2023 9.7 8.6 - 10.3 mg/dL Final         Failed - Na in normal range and within 180 days    Sodium  Date Value Ref Range Status  03/17/2023 140 135 - 146 mmol/L Final         Failed - Cr in normal range and within 180 days    Creat  Date Value Ref Range Status  03/17/2023 0.93 0.70 - 1.30 mg/dL Final         Failed - Cl in normal range and within 180 days    Chloride  Date Value Ref Range Status  03/17/2023 104 98 - 110 mmol/L Final         Failed - Mg Level in normal range and within 180 days    No results found for: MG       Passed - Last BP in normal range    BP Readings from Last 1 Encounters:  09/21/23 138/88         Passed - Valid encounter within last 6 months    Recent Outpatient Visits           1 month ago Abnormal glucose   [place] [facility] [place] Marsa PARAS, DO               Oxycodone  HCl 10 MG TABS 84 tablet 0    Sig: Take 1 tablet (10 mg total) by mouth every 4 (four) hours as needed (severe pain).     Not Delegated - Analgesics:  Opioid Agonists Failed - 10/21/2023  1:43 PM      Failed - This refill cannot be delegated      Failed - Urine Drug Screen completed in last 360 days      Passed - Valid encounter within last 3 months    Recent Outpatient  Visits           1 month ago Abnormal glucose   [place] [facility] [place], Marsa PARAS, DO              Refused Prescriptions Disp Refills   gabapentin  (NEURONTIN ) 300 MG capsule 180 capsule 3    Sig: Take 2 capsules (600 mg total) by mouth 3 (three) times daily as needed (pain).     Neurology: Anticonvulsants - gabapentin  Passed - 10/21/2023  1:43 PM      Passed - Cr in normal range and within 360 days    Creat  Date Value Ref Range Status  03/17/2023 0.93 0.70 - 1.30 mg/dL Final         Passed - Completed PHQ-2 or PHQ-9 in the last  360 days      Passed - Valid encounter within last 12 months    Recent Outpatient Visits           1 month ago Abnormal glucose   [place] [facility] [place], Marsa PARAS, [place]

## 2023-10-26 ENCOUNTER — Encounter (INDEPENDENT_AMBULATORY_CARE_PROVIDER_SITE_OTHER): Payer: Self-pay

## 2023-10-26 ENCOUNTER — Encounter (INDEPENDENT_AMBULATORY_CARE_PROVIDER_SITE_OTHER): Payer: Self-pay | Admitting: Nurse Practitioner

## 2023-11-01 ENCOUNTER — Other Ambulatory Visit: Payer: Self-pay

## 2023-11-01 ENCOUNTER — Other Ambulatory Visit: Payer: Self-pay | Admitting: Family Medicine

## 2023-11-01 DIAGNOSIS — T07XXXA Unspecified multiple injuries, initial encounter: Secondary | ICD-10-CM

## 2023-11-01 DIAGNOSIS — M79605 Pain in left leg: Secondary | ICD-10-CM

## 2023-11-01 MED ORDER — OXYCODONE HCL 10 MG PO TABS: 10.0000 mg | ORAL_TABLET | ORAL | 0 refills | Status: DC | PRN
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
## 2023-11-01 NOTE — Patient Instructions (Signed)
 Visit Information  Mr. Alvillar was given information about Medicaid Managed Care team care coordination services as a part of their [facility] Community Plan Medicaid benefit. Lamar PARAS Doolen {MMPTINSTRXCONSENTCHOICES:24940} to engagement with the [facility] Managed Care team.   If you are experiencing a medical emergency, please call 911 or report to your local emergency department or urgent care.   If you have a non-emergency medical problem during routine business hours, please contact your provider's office and ask to speak with a nurse.   For questions related to your [facility], please call: [phone] or visit the homepage here: [email or web address]  If you would like to schedule transportation through your [facility], please call the following number at least 2 days in advance of your appointment: [phone]   Rides for urgent appointments can also be made after hours by calling Member Services.  Call the Behavioral Health Crisis Line at [phone], at any time, 24 hours a day, 7 days a week. If you are in danger or need immediate medical attention call 911.  If you would like help to quit smoking, call 1-800-QUIT-NOW (325 861 5075) OR Espaol: 1-855-Djelo-Ya (8-144-664-6430) o para ms informacin haga clic aqu or Text READY to 799-599 to register via text  Mr. Bosserman - following are the goals we discussed in your visit today:   Goals Addressed   None     Please see education materials related to *** provided {MMEDCHOICES:24216}  {CCM PT INSTRUCTIONS:22237}  {MM FOLLOW UP EOJW:75841}  SIG***  Following is a copy of your plan of care:  There are no care plans that you recently modified to display for this patient.

## 2023-11-26 ENCOUNTER — Telehealth: Payer: Self-pay

## 2023-11-26 NOTE — Patient Instructions (Signed)
 [name] Bailiff - I am sorry I was unable to reach you today for our scheduled appointment. I work with Edman, Marsa JINNY, DO and am calling to support your healthcare needs. Please contact me at [phone] at your earliest convenience. I look forward to speaking with you soon.   Thank you,   Jackson Acron [facility] Health RN Care Manager Direct Dial: [phone]  Fax: [phone] Website: [email or web address]

## 2023-12-08 ENCOUNTER — Other Ambulatory Visit: Payer: Self-pay | Admitting: Family Medicine

## 2023-12-08 DIAGNOSIS — T07XXXA Unspecified multiple injuries, initial encounter: Secondary | ICD-10-CM

## 2023-12-08 DIAGNOSIS — M79602 Pain in left arm: Secondary | ICD-10-CM

## 2023-12-08 DIAGNOSIS — M79605 Pain in left leg: Secondary | ICD-10-CM

## 2023-12-08 DIAGNOSIS — M7989 Other specified soft tissue disorders: Secondary | ICD-10-CM

## 2023-12-08 NOTE — Telephone Encounter (Unsigned)
 Copied from CRM #[id number]. Topic: Clinical - Medication Refill >> Dec 08, 2023  3:35 PM Dedra B wrote: Medication: furosemide  (LASIX ) 20 MG tablet gabapentin  (NEURONTIN ) 300 MG capsule Oxycodone  HCl 10 MG TABS  Has the patient contacted their pharmacy? Yes pharmacy called him   This is the patient's preferred pharmacy:  [id number] - [place], [place] - [address] [place] [address] Phone: [phone] Fax: [phone]  Is this the correct pharmacy for this prescription? Yes  Has the prescription been filled recently? No  Is the patient out of the medication? No a few of each left  Has the patient been seen for an appointment in the last year OR does the patient have an upcoming appointment? Yes  Can we respond through MyChart? Yes  Agent: Please be advised that Rx refills may take up to 3 business days. We ask that you follow-up with your pharmacy.

## 2023-12-08 NOTE — Telephone Encounter (Unsigned)
 Copied from CRM [id number]. Topic: Clinical - Medication Refill >> Dec 08, 2023  3:39 PM DeAngela L wrote: Medication: furosemide  (LASIX ) 20 MG tablet gabapentin  (NEURONTIN ) 300 MG capsule Oxycodone  HCl 10 MG TABS  Has the patient contacted their pharmacy? Yes  (Agent: If no, request that the patient contact the pharmacy for the refill. If patient does not wish to contact the pharmacy document the reason why and proceed with request.) (Agent: If yes, when and what did the pharmacy advise?)  This is the patient's preferred pharmacy:  [id number] - [place], [place] - [address] [place] [address] Phone: [phone] Fax: [phone]  Is this the correct pharmacy for this prescription? Yes  If no, delete pharmacy and type the correct one.   Has the prescription been filled recently? Yes   Is the patient out of the medication? Yes   Has the patient been seen for an appointment in the last year OR does the patient have an upcoming appointment? Yes   Can we respond through MyChart? No   Agent: Please be advised that Rx refills may take up to 3 business days. We ask that you follow-up with your pharmacy.

## 2023-12-09 MED ORDER — OXYCODONE HCL 10 MG PO TABS: 10.0000 mg | ORAL_TABLET | ORAL | 0 refills | Status: DC | PRN

## 2023-12-09 MED ORDER — FUROSEMIDE 20 MG PO TABS: 20.0000 mg | ORAL_TABLET | Freq: Every day | ORAL | 5 refills | Status: DC

## 2023-12-09 MED ORDER — GABAPENTIN 300 MG PO CAPS: 600.0000 mg | ORAL_CAPSULE | Freq: Three times a day (TID) | ORAL | 3 refills | Status: DC | PRN

## 2023-12-09 NOTE — Telephone Encounter (Signed)
 Requested medication (s) are due for refill today: yes  Requested medication (s) are on the active medication list: yes  Last refill:  10/31/23  Future visit scheduled: yes  Notes to clinic:  Unable to refill per protocol, cannot delegate.      Requested Prescriptions  Pending Prescriptions Disp Refills   furosemide  (LASIX ) 20 MG tablet 30 tablet 5    Sig: Take 1-2 tablets (20-40 mg total) by mouth daily.     Cardiovascular:  Diuretics - Loop Failed - 12/09/2023  4:00 PM      Failed - K in normal range and within 180 days    Potassium  Date Value Ref Range Status  03/17/2023 3.9 3.5 - 5.3 mmol/L Final         Failed - Ca in normal range and within 180 days    Calcium  Date Value Ref Range Status  03/17/2023 9.7 8.6 - 10.3 mg/dL Final         Failed - Na in normal range and within 180 days    Sodium  Date Value Ref Range Status  03/17/2023 140 135 - 146 mmol/L Final         Failed - Cr in normal range and within 180 days    Creat  Date Value Ref Range Status  03/17/2023 0.93 0.70 - 1.30 mg/dL Final         Failed - Cl in normal range and within 180 days    Chloride  Date Value Ref Range Status  03/17/2023 104 98 - 110 mmol/L Final         Failed - Mg Level in normal range and within 180 days    No results found for: MG       Passed - Last BP in normal range    BP Readings from Last 1 Encounters:  09/21/23 138/88         Passed - Valid encounter within last 6 months    Recent Outpatient Visits           2 months ago Abnormal glucose   [place] [facility] [place], Marsa PARAS, DO               gabapentin  (NEURONTIN ) 300 MG capsule 180 capsule 3    Sig: Take 2 capsules (600 mg total) by mouth 3 (three) times daily as needed (pain).     Neurology: Anticonvulsants - gabapentin  Passed - 12/09/2023  4:00 PM      Passed - Cr in normal range and within 360 days    Creat  Date Value Ref Range Status  03/17/2023 0.93 0.70 -  1.30 mg/dL Final         Passed - Completed PHQ-2 or PHQ-9 in the last 360 days      Passed - Valid encounter within last 12 months    Recent Outpatient Visits           2 months ago Abnormal glucose   [place] [facility] [place], Marsa PARAS, DO               Oxycodone  HCl 10 MG TABS 84 tablet 0    Sig: Take 1 tablet (10 mg total) by mouth every 4 (four) hours as needed (severe pain).     Not Delegated - Analgesics:  Opioid Agonists Failed - 12/09/2023  4:00 PM      Failed - This refill cannot be delegated  Failed - Urine Drug Screen completed in last 360 days      Passed - Valid encounter within last 3 months    Recent Outpatient Visits           2 months ago Abnormal glucose   [place] [facility] [place], Marsa PARAS, [place]

## 2023-12-09 NOTE — Telephone Encounter (Signed)
 Requested medication (s) are due for refill today: yes  Requested medication (s) are on the active medication list: yes  Last refill:  11/03/23  Future visit scheduled: {Yes  Notes to clinic:  Unable to refill per protocol, cannot delegate.      Requested Prescriptions  Pending Prescriptions Disp Refills   furosemide  (LASIX ) 20 MG tablet 30 tablet 5    Sig: Take 1-2 tablets (20-40 mg total) by mouth daily.     Cardiovascular:  Diuretics - Loop Failed - 12/09/2023  3:58 PM      Failed - K in normal range and within 180 days    Potassium  Date Value Ref Range Status  03/17/2023 3.9 3.5 - 5.3 mmol/L Final         Failed - Ca in normal range and within 180 days    Calcium  Date Value Ref Range Status  03/17/2023 9.7 8.6 - 10.3 mg/dL Final         Failed - Na in normal range and within 180 days    Sodium  Date Value Ref Range Status  03/17/2023 140 135 - 146 mmol/L Final         Failed - Cr in normal range and within 180 days    Creat  Date Value Ref Range Status  03/17/2023 0.93 0.70 - 1.30 mg/dL Final         Failed - Cl in normal range and within 180 days    Chloride  Date Value Ref Range Status  03/17/2023 104 98 - 110 mmol/L Final         Failed - Mg Level in normal range and within 180 days    No results found for: MG       Passed - Last BP in normal range    BP Readings from Last 1 Encounters:  09/21/23 138/88         Passed - Valid encounter within last 6 months    Recent Outpatient Visits           2 months ago Abnormal glucose   [place] [facility] [place], Marsa PARAS, DO               gabapentin  (NEURONTIN ) 300 MG capsule 180 capsule 3    Sig: Take 2 capsules (600 mg total) by mouth 3 (three) times daily as needed (pain).     Neurology: Anticonvulsants - gabapentin  Passed - 12/09/2023  3:58 PM      Passed - Cr in normal range and within 360 days    Creat  Date Value Ref Range Status  03/17/2023 0.93 0.70 -  1.30 mg/dL Final         Passed - Completed PHQ-2 or PHQ-9 in the last 360 days      Passed - Valid encounter within last 12 months    Recent Outpatient Visits           2 months ago Abnormal glucose   [place] [facility], Marsa PARAS, DO               Oxycodone  HCl 10 MG TABS 84 tablet 0    Sig: Take 1 tablet (10 mg total) by mouth every 4 (four) hours as needed (severe pain).     Not Delegated - Analgesics:  Opioid Agonists Failed - 12/09/2023  3:58 PM      Failed - This refill cannot be delegated  Failed - Urine Drug Screen completed in last 360 days      Passed - Valid encounter within last 3 months    Recent Outpatient Visits           2 months ago Abnormal glucose   [place] [facility] [place], Marsa PARAS, [place]

## 2023-12-14 ENCOUNTER — Ambulatory Visit (INDEPENDENT_AMBULATORY_CARE_PROVIDER_SITE_OTHER): Payer: Self-pay

## 2023-12-14 ENCOUNTER — Ambulatory Visit (INDEPENDENT_AMBULATORY_CARE_PROVIDER_SITE_OTHER): Admitting: Nurse Practitioner

## 2023-12-14 ENCOUNTER — Encounter (INDEPENDENT_AMBULATORY_CARE_PROVIDER_SITE_OTHER): Payer: Self-pay | Admitting: Nurse Practitioner

## 2023-12-14 VITALS — BP 136/83 | HR 90 | Ht 74.0 in | Wt 219.5 lb

## 2023-12-14 DIAGNOSIS — I83892 Varicose veins of left lower extremities with other complications: Secondary | ICD-10-CM | POA: Diagnosis not present

## 2023-12-14 DIAGNOSIS — M7989 Other specified soft tissue disorders: Secondary | ICD-10-CM | POA: Diagnosis not present

## 2023-12-14 NOTE — Progress Notes (Signed)
 Subjective:    Patient ID: Michael [name], male    DOB: [birth year], 53 y.o.   MRN: [id number] Chief Complaint  Patient presents with   Establish Care    The patient presents today for evaluation of pain and swelling in his left lower extremity.  He notes that this occurred after a significant accident a little over a year ago.  He notes that the swelling is better in the leg that once was but at the end of the day.  He has still experienced significant swelling pain and discomfort in the lower extremity.  He does not wear compression consistently.  He uses Lasix  at times but it does not help to control the edema.  He denies any active open wounds or ulcerations.  Today noninvasive studies show no evidence of DVT or superficial thrombophlebitis of the left lower extremity.  Today does have evidence of venous reflux in the great saphenous vein.  No deep venous insufficiency noted today.    Review of Systems  Cardiovascular:  Positive for leg swelling.  All other systems reviewed and are negative.      Objective:   Physical Exam HENT:     Head: Normocephalic.  Cardiovascular:     Rate and Rhythm: Normal rate.  Pulmonary:     Effort: Pulmonary effort is normal.  Musculoskeletal:     Left lower leg: Edema present.  Skin:    General: Skin is warm and dry.  Neurological:     Mental Status: He is alert and oriented to person, place, and time.  Psychiatric:        Mood and Affect: Mood normal.        Behavior: Behavior normal.        Thought Content: Thought content normal.        Judgment: Judgment normal.     BP 136/83   Pulse 90   Ht 6' 2 (1.88 m)   Wt 219 lb 8 oz (99.6 kg)   BMI 28.18 kg/m   Past Medical History:  Diagnosis Date   Patient denies medical problems     Social History   Socioeconomic History   Marital status: Single    Spouse name: Not on file   Number of children: Not on file   Years of education: Not on file   Highest education level: Not  on file  Occupational History   Not on file  Tobacco Use   Smoking status: Never   Smokeless tobacco: Never  Vaping Use   Vaping status: Never Used  Substance and Sexual Activity   Alcohol use: Not Currently    Alcohol/week: 3.0 standard drinks of alcohol    Types: 3 Cans of beer per week   Drug use: Yes    Types: Marijuana   Sexual activity: Not on file  Other Topics Concern   Not on file  Social History Narrative   Not on file   Social Drivers of Health   Financial Resource Strain: Low Risk  (10/07/2023)   Overall Financial Resource Strain (CARDIA)    Difficulty of Paying Living Expenses: Not very hard  Food Insecurity: Food Insecurity Present (11/01/2023)   Hunger Vital Sign    Worried About Running Out of Food in the Last Year: Never true    Ran Out of Food in the Last Year: Sometimes true  Transportation Needs: No Transportation Needs (10/07/2023)   PRAPARE - Transportation    Lack of Transportation (Medical): No    Lack  of Transportation (Non-Medical): No  Physical Activity: Inactive (10/07/2023)   Exercise Vital Sign    Days of Exercise per Week: 0 days    Minutes of Exercise per Session: 0 min  Stress: No Stress Concern Present (10/07/2023)   [employer] of Occupational Health - Occupational Stress Questionnaire    Feeling of Stress: Only a little  Social Connections: Socially Isolated (10/07/2023)   Social Connection and Isolation Panel    Frequency of Communication with Friends and Family: More than three times a week    Frequency of Social Gatherings with Friends and Family: More than three times a week    Attends Religious Services: Never    Database administrator or Organizations: No    Attends Banker Meetings: Never    Marital Status: Never married  Intimate Partner Violence: Not At Risk (10/07/2023)   Humiliation, Afraid, Rape, and Kick questionnaire    Fear of Current or Ex-Partner: No    Emotionally Abused: No    Physically Abused: No     Sexually Abused: No    Past Surgical History:  Procedure Laterality Date   IRRIGATION AND DEBRIDEMENT ABSCESS Right 04/18/2018   Procedure: IRRIGATION AND DEBRIDEMENT ABSCESS RIGHT ARM;  Surgeon: Desiderio Schanz, MD;  Location: [facility] ORS;  Service: General;  Laterality: Right;   NO PAST SURGERIES      Family History  Problem Relation Age of Onset   Heart failure Mother    Obesity Brother     No Known Allergies     Latest Ref Rng & Units 03/17/2023    8:08 AM 04/17/2018    4:33 AM 04/16/2018    4:45 AM  CBC  WBC 3.8 - 10.8 Thousand/uL 6.0  9.7  11.3   Hemoglobin 13.2 - 17.1 g/dL 86.2  86.6  87.0   Hematocrit 38.5 - 50.0 % 40.5  40.0  38.5   Platelets 140 - 400 Thousand/uL 283  229  207       CMP     Component Value Date/Time   NA 140 03/17/2023 0808   K 3.9 03/17/2023 0808   CL 104 03/17/2023 0808   CO2 26 03/17/2023 0808   GLUCOSE 107 (H) 03/17/2023 0808   BUN 15 03/17/2023 0808   CREATININE 0.93 03/17/2023 0808   CALCIUM 9.7 03/17/2023 0808   PROT 7.5 03/17/2023 0808   ALBUMIN 4.2 04/14/2018 1551   AST 15 03/17/2023 0808   ALT 13 03/17/2023 0808   ALKPHOS 82 04/14/2018 1551   BILITOT 0.9 03/17/2023 0808   EGFR 98 03/17/2023 0808   GFRNONAA >60 04/18/2018 0340     No results found.     Assessment & Plan:   1. 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  Following this discussion the patient wishes to engage with conservative therapy before moving forward with any further intervention.  He is advised to wear medical grade compression stockings daily.  Patient put them on first thing in the morning and then before bedtime.  They should be 20 to 30 mmHg for tightness.  There is also advised to elevate his lower extremities when.  Patient also endeavor to get 15 to 20 minutes of exercise 3 to 4 days/week.  Will have the patient return in 3 months to determine progress with symptoms  there is no improvement or if we need to revisit the idea of moving forward with endovenous ablation.  2. Leg  swelling Based upon the patient's timeline and the swelling he is experiencing I have a suspicion that he may have some underlying lymphedema.  I did discuss with the patient that while he has venous insufficiency that is amenable to treatment by endovenous ablation, this may not completely solve his swelling.  Patient will follow conservative therapy guidelines as noted above.   Current Outpatient Medications on File Prior to Visit  Medication Sig Dispense Refill   acetaminophen  (TYLENOL ) 500 MG tablet Take by mouth.     furosemide  (LASIX ) 20 MG tablet Take 1-2 tablets (20-40 mg total) by mouth daily. 30 tablet 5   gabapentin  (NEURONTIN ) 300 MG capsule Take 2 capsules (600 mg total) by mouth 3 (three) times daily as needed (pain). 180 capsule 3   ibuprofen  (ADVIL ) 200 MG tablet Take 200 mg by mouth every 6 (six) hours as needed.     methocarbamol  (ROBAXIN ) 500 MG tablet Take 2 tablets (1,000 mg total) by mouth every 8 (eight) hours as needed for muscle spasms. 180 tablet 2   mupirocin  ointment (BACTROBAN ) 2 % Apply 1 Application topically 2 (two) times daily. For 2 weeks and repeat if needed for scab, wound healing. 22 g 1   Oxycodone  HCl 10 MG TABS Take 1 tablet (10 mg total) by mouth every 4 (four) hours as needed (severe pain). 84 tablet 0   No current facility-administered medications on file prior to visit.    There are no Patient Instructions on file for this visit. No follow-ups on file.   Elijah Michaelis E Vadim Centola, NP

## 2023-12-27 ENCOUNTER — Telehealth: Payer: Self-pay

## 2023-12-27 NOTE — Patient Instructions (Signed)
 [name] Bailiff - I am sorry I was unable to reach you today for our scheduled appointment. I work with Edman, Marsa JINNY, DO and am calling to support your healthcare needs. Please contact me at [phone] at your earliest convenience. I look forward to speaking with you soon.   Thank you,   Jackson Acron [facility] Health RN Care Manager Direct Dial: [phone]  Fax: [phone] Website: [email or web address]

## 2024-01-06 ENCOUNTER — Other Ambulatory Visit: Payer: Self-pay | Admitting: Family Medicine

## 2024-01-06 DIAGNOSIS — M79605 Pain in left leg: Secondary | ICD-10-CM

## 2024-01-06 DIAGNOSIS — T07XXXA Unspecified multiple injuries, initial encounter: Secondary | ICD-10-CM

## 2024-01-06 NOTE — Telephone Encounter (Unsigned)
 Copied from CRM #[id number]. Topic: Clinical - Medication Refill >> Jan 06, 2024  3:36 PM Tiffini S wrote: Medication: Oxycodone  HCl 10 MG TABS  Has the patient contacted their pharmacy? Yes (Agent: If no, request that the patient contact the pharmacy for the refill. If patient does not wish to contact the pharmacy document the reason why and proceed with request.) (Agent: If yes, when and what did the pharmacy advise?)  This is the patient's preferred pharmacy:  [id number] - [place], [place] - [address] [place] [address] Phone: [phone] Fax: [phone]  Is this the correct pharmacy for this prescription? Yes If no, delete pharmacy and type the correct one.   Has the prescription been filled recently? Yes  Is the patient out of the medication? Yes  Has the patient been seen for an appointment in the last year OR does the patient have an upcoming appointment? Yes  Can we respond through MyChart? No, please call the patient at [phone]  Agent: Please be advised that Rx refills may take up to 3 business days. We ask that you follow-up with your pharmacy.

## 2024-01-07 MED ORDER — OXYCODONE HCL 10 MG PO TABS: 10.0000 mg | ORAL_TABLET | ORAL | 0 refills | Status: DC | PRN

## 2024-01-07 NOTE — Telephone Encounter (Signed)
 Requested medication (s) are due for refill today: yes  Requested medication (s) are on the active medication list: yes  Last refill:  12/09/23 #84  Future visit scheduled: yes  Notes to clinic:  med not delegated to NT to RF   Requested Prescriptions  Pending Prescriptions Disp Refills   Oxycodone  HCl 10 MG TABS 84 tablet 0    Sig: Take 1 tablet (10 mg total) by mouth every 4 (four) hours as needed (severe pain).     Not Delegated - Analgesics:  Opioid Agonists Failed - 01/07/2024  1:30 PM      Failed - This refill cannot be delegated      Failed - Urine Drug Screen completed in last 360 days      Failed - Valid encounter within last 3 months    Recent Outpatient Visits           3 months ago Abnormal glucose   [facility] [place], Marsa PARAS, [place]

## 2024-01-14 ENCOUNTER — Telehealth: Payer: Self-pay

## 2024-01-19 ENCOUNTER — Telehealth: Payer: Self-pay

## 2024-01-20 NOTE — Patient Instructions (Signed)
 Thank you for allowing the Complex Care Management team to participate in your care. It was great speaking with you.  Keep up the great work managing your care! Please do not hesitate to contact your PCP if your health needs change and you require additional outreach. The care team will gladly assist.  Visit Information   [name] was given information about Medicaid Managed Care team care coordination services as a part of their [facility] Medicaid benefit. Michael [name] verbally consented to engagement with the [facility] Managed Care team.    If you are experiencing a medical emergency, please call 911 or report to your local emergency department or urgent care.    If you have a non-emergency medical problem during routine business hours, please contact your provider's office and ask to speak with a nurse.    For questions related to your [facility], please call: [phone] or visit the homepage here: [email or web address]   If you would like to schedule transportation through your [facility], please call the following number at least 2 days in advance of your appointment: [phone]   Rides for urgent appointments can also be made after hours by calling Member Services.   Call the Behavioral Health Crisis Line at [phone], at any time, 24 hours a day, 7 days a week. If you are in danger or need immediate medical attention call 911.   If you would like help to quit smoking, call 1-800-QUIT-NOW (318-073-5116) OR Espaol: 1-855-Djelo-Ya (8-144-664-6430) o para ms informacin haga clic aqu or Text READY to 799-599 to register via text   Michael [name] - following are the goals we discussed in your visit today:   Goals Addressed             This Visit's Progress    COMPLETED: VBCI RNCM:  Monitor, Self-Manage And Reduce Symptoms Chronic  Venous Insufficiency       Problems:  Chronic Disease Management support and education needs related to Chronic Venous Insufficiency   Goal: Over the next 3 months the patient will: Attend all scheduled medical appointments Take all medications exactly as prescribed  Demonstrate ongoing adherence to prescribed treatment plan for Chronic Venous Insufficiency Not experience hospital admission for complications related to Chronic Venous Insufficiency   Chronic Venous Insufficiency:Interventions (Status: Goal on track  ) Long Term Goal  Reviewed current treatment plan, self-management, and adherence to plan as established by provider.  Patient remains compliant with plan.  Reports excellent compliance with medications. Denies current concerns regarding medication management or prescription cost. Reviewed safety and fall prevention measures. Reports significant improvement with mobility. Reports episodes of edema to his left leg which is a chronic issue secondary to multiple fractures. Reports keeping extremity elevated as advised and avoiding prolonged walking/standing. Denies falls. Reports using cane occasionally if needed. Discussed plan for follow up with the Vascular Surgery team on March 14, 2024. Reviewed worsening signs and symptoms and indications for seeking immediate medical attention.    Patient Self-Care Activities:  Attend all scheduled provider appointments Reschedule Vascular Surgery evaluation after confirming work schedule Take medications as prescribed   Call pharmacy for medication refills 3-7 days in advance of running out of medications Utilize assistive device as needed with ambulation Keep pathways clear and well lit Wear secure non skid footwear with all ambulation Follow recommended safety and fall prevention measures Keep the care management team updated of in-home needs Call provider office for new concerns or  questions                   Please  see education materials related to Influenza Prevention provided by MyChart link.   Patient verbalizes understanding of instructions and care plan provided today and agrees to view in MyChart. Active MyChart status and patient understanding of how to access instructions and care plan via MyChart confirmed with patient.      The patient has met care management goals and has been advised to contact the primary care team with any new health related questions or concerns.    Jackson Acron [facility] Population Health RN Care Manager Direct Dial: [phone]  Fax: [phone] Website: [email or web address]

## 2024-01-20 NOTE — Patient Outreach (Signed)
 Complex Care Management   Visit Note    Name:  Michael [name] MRN: [id number] DOB: 1971/02/14  Situation: Referral received for Complex Care Management related to Chronic Venous Insuffiencey I obtained verbal consent from Patient.  Visit completed with Michael [name] via telephone.  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   Brief reactive psychosis (HCC) 01/31/2018    Assessment: Patient Reported Symptoms: Cognitive Cognitive Status: Alert and oriented to person, place, and time, Normal speech and language skills Cognitive/Intellectual Conditions Management [RPT]: None reported or documented in medical history or problem list Health Maintenance Behaviors: Annual physical exam, Stress management, Sleep adequate, Hobbies Healing Pattern: Average Health Facilitated by: Pain control, Rest  Neurological Neurological Review of Symptoms: No symptoms reported Neurological Management Strategies: Routine screening Neurological Self-Management Outcome: 4 (good)  HEENT HEENT Symptoms Reported: No symptoms reported HEENT Management Strategies: Routine screening HEENT Self-Management Outcome: 4 (good)  Cardiovascular Cardiovascular Symptoms Reported: No symptoms reported Does patient have uncontrolled Hypertension?: No Cardiovascular Management Strategies: Routine screening, Medication therapy Cardiovascular Self-Management Outcome: 4 (good)  Respiratory Respiratory Symptoms Reported: No symptoms reported Respiratory Management Strategies: Routine screening Respiratory Self-Management Outcome: 4 (good)  Endocrine Endocrine Symptoms Reported: No symptoms reported Is patient diabetic?: No Endocrine Self-Management Outcome: 4 (good)  Gastrointestinal Gastrointestinal Symptoms Reported: No symptoms  reported Gastrointestinal Self-Management Outcome: 4 (good)  Genitourinary Genitourinary Symptoms Reported: No symptoms reported Genitourinary Self-Management Outcome: 4 (good)  Integumentary Integumentary Symptoms Reported: No symptoms reported Skin Management Strategies: Routine screening Skin Self-Management Outcome: 4 (good)  Musculoskeletal Musculoskelatal Symptoms Reviewed: No symptoms reported Musculoskeletal Management Strategies: Routine screening, Medication therapy Musculoskeletal Self-Management Outcome: 4 (good) Falls in the past year?: Yes Number of falls in past year: 2 or more Was there an injury with Fall?: Yes Fall Risk Category Calculator: 3 Patient Fall Risk Level: High Fall Risk Patient at Risk for Falls Due to: History of fall(s), Orthopedic patient, Impaired balance/gait Fall risk Follow up: Falls prevention discussed  Psychosocial Psychosocial Symptoms Reported: No symptoms reported Behavioral Management Strategies: Coping strategies, Support system Behavioral Health Self-Management Outcome: 5 (very good) Major Change/Loss/Stressor/Fears (CP): Denies Behaviors When Feeling Stressed/Fearful: Rest. Reports previous stressors related to legal concerns have been resolved Techniques to Cope with Loss/Stress/Change: Diversional activities Quality of Family Relationships: supportive, helpful, involved Do you feel physically threatened by others?: No    01/20/2024    PHQ2-9 Depression Screening   Little interest or pleasure in doing things Not at all  Feeling down, depressed, or hopeless Not at all  PHQ-2 - Total Score 0    There were no vitals filed for this visit.  Medications Reviewed Today     Reviewed by Karoline Lima, RN (Registered Nurse) on 01/19/24 at 1341  Med List Status: <None>   Medication Order Taking? Sig Documenting Provider Last Dose Status Informant  acetaminophen  (TYLENOL ) 500 MG tablet [id number]  Take by mouth. [provider]   Active   furosemide  (LASIX ) 20 MG tablet [id number]  Take 1-2 tablets (20-40 mg total) by mouth daily. [name], DO  Active   gabapentin  (NEURONTIN ) 300 MG capsule [id number]  Take 2 capsules (600 mg total) by mouth 3 (three) times daily as needed (pain). [name], DO  Active   ibuprofen  (ADVIL ) 200 MG tablet [id number]  Take 200 mg by mouth every 6 (six) hours  as needed. [provider]  Active   methocarbamol  (ROBAXIN ) 500 MG tablet [id number]  Take 2 tablets (1,000 mg total) by mouth every 8 (eight) hours as needed for muscle spasms. Edman Marsa PARAS, DO  Active   mupirocin  ointment (BACTROBAN ) 2 % [id number]  Apply 1 Application topically 2 (two) times daily. For 2 weeks and repeat if needed for scab, wound healing. Edman Marsa PARAS, DO  Active   Oxycodone  HCl 10 MG TABS [id number]  Take 1 tablet (10 mg total) by mouth every 4 (four) hours as needed (severe pain). Edman Marsa PARAS, DO  Active             Recommendation:   Continue Current Plan of Care  Follow Up Plan:   Patient has met all care management goals.  Patient has been provided contact information should new needs arise.    Jackson Acron [facility] Population Health RN Care Manager Direct Dial: [phone]  Fax: [phone] Website: [email or web address]

## 2024-02-14 ENCOUNTER — Telehealth: Payer: Self-pay

## 2024-02-14 ENCOUNTER — Other Ambulatory Visit: Payer: Self-pay

## 2024-02-14 DIAGNOSIS — M7989 Other specified soft tissue disorders: Secondary | ICD-10-CM

## 2024-02-14 MED ORDER — MUPIROCIN 2 % EX OINT: 1.0000 | TOPICAL_OINTMENT | Freq: Two times a day (BID) | CUTANEOUS | 1 refills | Status: AC

## 2024-02-14 NOTE — Telephone Encounter (Signed)
 Spoke to patient, a refill was needed for ointment. That was sent in

## 2024-02-14 NOTE — Telephone Encounter (Signed)
 Copied from CRM [id number]. Topic: Clinical - Medication Question >> Feb 14, 2024  3:50 PM Rosaria E wrote: Reason for CRM: pt has questions regarding his mupirocin  ointment (BACTROBAN ) 2 % Wants to speak to a nurse  Best contact: [phone]    ----------------------------------------------------------------------- From previous Reason for Contact - Pink Word Triage: Pink Word triggered transfer to Nurse Triage. See Triage Message for details.

## 2024-03-14 ENCOUNTER — Ambulatory Visit (INDEPENDENT_AMBULATORY_CARE_PROVIDER_SITE_OTHER): Admitting: Nurse Practitioner

## 2024-03-14 ENCOUNTER — Encounter (INDEPENDENT_AMBULATORY_CARE_PROVIDER_SITE_OTHER): Payer: Self-pay | Admitting: Nurse Practitioner

## 2024-03-14 VITALS — BP 138/91 | HR 76 | Resp 18 | Wt 217.0 lb

## 2024-03-14 DIAGNOSIS — I83892 Varicose veins of left lower extremities with other complications: Secondary | ICD-10-CM
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
## 2024-03-22 ENCOUNTER — Other Ambulatory Visit

## 2024-03-22 DIAGNOSIS — R7309 Other abnormal glucose: Secondary | ICD-10-CM

## 2024-03-22 DIAGNOSIS — Z0000 Encounter for general adult medical examination without abnormal findings: Secondary | ICD-10-CM

## 2024-03-22 DIAGNOSIS — E7800 Pure hypercholesterolemia, unspecified: Secondary | ICD-10-CM

## 2024-03-22 DIAGNOSIS — Z125 Encounter for screening for malignant neoplasm of prostate: Secondary | ICD-10-CM

## 2024-03-22 LAB — CBC WITH DIFFERENTIAL/PLATELET
Absolute Lymphocytes: 1416 {cells}/uL (ref 850–3900)
Absolute Monocytes: 412 {cells}/uL (ref 200–950)
Basophils Absolute: 29 {cells}/uL (ref 0–200)
Basophils Relative: 0.6 %
Eosinophils Absolute: 59 {cells}/uL (ref 15–500)
Eosinophils Relative: 1.2 %
HCT: 47.6 % (ref 39.4–51.1)
Hemoglobin: 16.3 g/dL (ref 13.2–17.1)
MCH: 32.1 pg (ref 27.0–33.0)
MCHC: 34.2 g/dL (ref 31.6–35.4)
MCV: 93.7 fL (ref 81.4–101.7)
MPV: 10.2 fL (ref 7.5–12.5)
Monocytes Relative: 8.4 %
Neutro Abs: 2984 {cells}/uL (ref 1500–7800)
Neutrophils Relative %: 60.9 %
Platelets: 231 Thousand/uL (ref 140–400)
RBC: 5.08 Million/uL (ref 4.20–5.80)
RDW: 11.8 % (ref 11.0–15.0)
Total Lymphocyte: 28.9 %
WBC: 4.9 Thousand/uL (ref 3.8–10.8)

## 2024-03-22 LAB — COMPREHENSIVE METABOLIC PANEL WITH GFR
AG Ratio: 2 (calc) (ref 1.0–2.5)
ALT: 14 U/L (ref 9–46)
AST: 13 U/L (ref 10–35)
Albumin: 4.7 g/dL (ref 3.6–5.1)
Alkaline phosphatase (APISO): 75 U/L (ref 35–144)
BUN: 10 mg/dL (ref 7–25)
CO2: 27 mmol/L (ref 20–32)
Calcium: 9.5 mg/dL (ref 8.6–10.3)
Chloride: 103 mmol/L (ref 98–110)
Creat: 0.98 mg/dL (ref 0.70–1.30)
Globulin: 2.4 g/dL (ref 1.9–3.7)
Glucose, Bld: 72 mg/dL (ref 65–99)
Potassium: 4.8 mmol/L (ref 3.5–5.3)
Sodium: 140 mmol/L (ref 135–146)
Total Bilirubin: 0.4 mg/dL (ref 0.2–1.2)
Total Protein: 7.1 g/dL (ref 6.1–8.1)
eGFR: 91 mL/min/1.73m2 (ref >=60)

## 2024-03-22 LAB — PSA: PSA: 0.67 ng/mL (ref <=4.00)

## 2024-03-22 LAB — HEMOGLOBIN A1C
Hgb A1c MFr Bld: 5.6 % (ref <5.7)
Mean Plasma Glucose: 114 mg/dL
eAG (mmol/L): 6.3 mmol/L

## 2024-03-22 LAB — LIPID PANEL
Cholesterol: 191 mg/dL (ref <200)
HDL: 57 mg/dL (ref >=40)
LDL Cholesterol (Calc): 106 mg/dL — ABNORMAL HIGH
Non-HDL Cholesterol (Calc): 134 mg/dL — ABNORMAL HIGH (ref <130)
Total CHOL/HDL Ratio: 3.4 (calc) (ref <5.0)
Triglycerides: 166 mg/dL — ABNORMAL HIGH (ref <150)

## 2024-03-31 ENCOUNTER — Telehealth: Payer: Self-pay

## 2024-03-31 ENCOUNTER — Ambulatory Visit: Admitting: Family Medicine

## 2024-03-31 ENCOUNTER — Encounter: Payer: Self-pay | Admitting: Family Medicine

## 2024-03-31 VITALS — BP 138/94 | HR 84 | Ht 74.0 in | Wt 213.4 lb

## 2024-03-31 DIAGNOSIS — Z1211 Encounter for screening for malignant neoplasm of colon: Secondary | ICD-10-CM

## 2024-03-31 DIAGNOSIS — R7309 Other abnormal glucose: Secondary | ICD-10-CM | POA: Diagnosis not present

## 2024-03-31 DIAGNOSIS — E7800 Pure hypercholesterolemia, unspecified: Secondary | ICD-10-CM | POA: Insufficient documentation

## 2024-03-31 DIAGNOSIS — M79602 Pain in left arm: Secondary | ICD-10-CM

## 2024-03-31 DIAGNOSIS — I83892 Varicose veins of left lower extremities with other complications: Secondary | ICD-10-CM | POA: Diagnosis not present

## 2024-03-31 DIAGNOSIS — M79605 Pain in left leg: Secondary | ICD-10-CM

## 2024-03-31 DIAGNOSIS — Z0000 Encounter for general adult medical examination without abnormal findings: Secondary | ICD-10-CM | POA: Diagnosis not present

## 2024-03-31 DIAGNOSIS — I872 Venous insufficiency (chronic) (peripheral): Secondary | ICD-10-CM | POA: Diagnosis not present

## 2024-03-31 DIAGNOSIS — R030 Elevated blood-pressure reading, without diagnosis of hypertension: Secondary | ICD-10-CM

## 2024-03-31 DIAGNOSIS — T07XXXA Unspecified multiple injuries, initial encounter: Secondary | ICD-10-CM

## 2024-03-31 MED ORDER — OXYCODONE HCL 5 MG PO TABS: 5.0000 mg | ORAL_TABLET | ORAL | 0 refills | Status: DC | PRN

## 2024-03-31 MED ORDER — GABAPENTIN 300 MG PO CAPS: 600.0000 mg | ORAL_CAPSULE | Freq: Three times a day (TID) | ORAL | 1 refills | Status: AC | PRN

## 2024-03-31 NOTE — Telephone Encounter (Signed)
 Copied from CRM #[id number]. Topic: Clinical - Medication Prior Auth >> Mar 31, 2024 10:33 AM Myrick T wrote: Reason for CRM: patient called stated he needed a PA for the dosage change. Patient said provider said if he had issues with getting the medication covered by his insurance they could keep the dosage the same. Please advise patient.
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
## 2024-03-31 NOTE — Patient Instructions (Addendum)
 Thank you for coming to the office today.  Referral to GI for Colonoscopy  [facility]) [address] - [address] [place], [place] [address] Phone: [phone]  Elevated BP, keep checking at home if still >140/90 we can treat it.  Reduced Oxycodone  from 10 to 5mg   FUTURE Coronary Calcium Score Cardiac CT Scan. This is a screening test for patients aged 55-50+ with cardiovascular risk factors or who are healthy but would be interested in Cardiovascular Screening for heart disease. Even if there is a family history of heart disease, this imaging can be useful. Typically it can be done every 5+ years or at a different timeline we agree on  The scan will look at the chest and mainly focus on the heart and identify early signs of calcium build up or blockages within the heart arteries. It is not 100% accurate for identifying blockages or heart disease, but it is useful to help us  predict who may have some early changes or be at risk in the future for a heart attack or cardiovascular problem.  The results are reviewed by a Cardiologist and they will document the results. It should become available on MyChart. Typically the results are divided into percentiles based on other patients of the same demographic and age. So it will compare your risk to others similar to you. If you have a higher score >99 or higher percentile >75%tile, it is recommended to consider Statin cholesterol therapy and or referral to Cardiologist. I will try to help explain your results and if we have questions we can contact the Cardiologist.  You will be contacted for scheduling. Usually it is done at any imaging facility through [facility], [facility] or [facility].  The cost is $99 flat fee total and it does not go through insurance, so no authorization is required.   Please schedule a Follow-up Appointment to: No follow-ups on file.  If you have any  other questions or concerns, please feel free to call the office or send a message through MyChart. You may also schedule an earlier appointment if necessary.  Additionally, you may be receiving a survey about your experience at our office within a few days to 1 week by e-mail or mail. We value your feedback.  Marsa Officer, DO [facility], [place]

## 2024-04-06 ENCOUNTER — Other Ambulatory Visit: Payer: Self-pay

## 2024-04-06 ENCOUNTER — Telehealth: Payer: Self-pay

## 2024-04-06 DIAGNOSIS — Z1211 Encounter for screening for malignant neoplasm of colon: Secondary | ICD-10-CM

## 2024-04-06 MED ORDER — NA SULFATE-K SULFATE-MG SULF 17.5-3.13-1.6 GM/177ML PO SOLN: 354.0000 mL | Freq: Once | ORAL | 0 refills | Status: AC

## 2024-04-06 NOTE — Telephone Encounter (Signed)
 Gastroenterology Pre-Procedure Review  Request Date: 05/17/2024 Requesting Physician: Dr. Melany  PATIENT REVIEW QUESTIONS: The patient responded to the following health history questions as indicated:    1. Are you having any GI issues? no 2. Do you have a personal history of Polyps? no 3. Do you have a family history of Colon Cancer or Polyps? no 4. Diabetes Mellitus? no 5. Joint replacements in the past 12 months?no 6. Major health problems in the past 3 months?no 7. Any artificial heart valves, MVP, or defibrillator?no    MEDICATIONS & ALLERGIES:    Patient reports the following regarding taking any anticoagulation/antiplatelet therapy:   Plavix, Coumadin, Eliquis, Xarelto, Lovenox , Pradaxa, Brilinta, or Effient? no Aspirin? no  Patient confirms/reports the following medications:  Current Outpatient Medications  Medication Sig Dispense Refill   acetaminophen  (TYLENOL ) 500 MG tablet Take by mouth.     furosemide  (LASIX ) 20 MG tablet Take 1-2 tablets (20-40 mg total) by mouth daily. 30 tablet 5   gabapentin  (NEURONTIN ) 300 MG capsule Take 2 capsules (600 mg total) by mouth 3 (three) times daily as needed (pain). 180 capsule 1   ibuprofen  (ADVIL ) 200 MG tablet Take 200 mg by mouth every 6 (six) hours as needed.     methocarbamol  (ROBAXIN ) 500 MG tablet Take 2 tablets (1,000 mg total) by mouth every 8 (eight) hours as needed for muscle spasms. 180 tablet 2   mupirocin  ointment (BACTROBAN ) 2 % Apply 1 Application topically 2 (two) times daily. For 2 weeks and repeat if needed for scab, wound healing. 22 g 1   oxyCODONE  (OXY IR/ROXICODONE ) 5 MG immediate release tablet Take 1 tablet (5 mg total) by mouth every 4 (four) hours as needed for severe pain (pain score 7-10). 84 tablet 0   No current facility-administered medications for this visit.    Patient confirms/reports the following allergies:  Allergies[1]  No orders of the defined types were placed in this  encounter.   AUTHORIZATION INFORMATION Primary Insurance: 1D#: Group #:  Secondary Insurance: 1D#: Group #:  SCHEDULE INFORMATION: Date: 05/17/2024 Time: Location: Dr. Melany [facility]     [1] No Known Allergies

## 2024-05-09 ENCOUNTER — Other Ambulatory Visit: Payer: Self-pay | Admitting: Family Medicine

## 2024-05-09 DIAGNOSIS — M79602 Pain in left arm: Secondary | ICD-10-CM

## 2024-05-09 DIAGNOSIS — M79605 Pain in left leg: Secondary | ICD-10-CM

## 2024-05-09 DIAGNOSIS — T07XXXA Unspecified multiple injuries, initial encounter: Secondary | ICD-10-CM

## 2024-05-09 DIAGNOSIS — M7989 Other specified soft tissue disorders: Secondary | ICD-10-CM

## 2024-05-09 NOTE — Telephone Encounter (Signed)
 Copied from CRM [id number]. Topic: Clinical - Medication Refill >> May 09, 2024  2:20 PM Rosaria E wrote: Medication: oxyCODONE  (OXY IR/ROXICODONE ) 5 MG immediate release tablet furosemide  (LASIX ) 20 MG tablet gabapentin  (NEURONTIN ) 300 MG capsule  Has the patient contacted their pharmacy? Yes (Agent: If no, request that the patient contact the pharmacy for the refill. If patient does not wish to contact the pharmacy document the reason why and proceed with request.) (Agent: If yes, when and what did the pharmacy advise?)  This is the patient's preferred pharmacy:  [id number] - [place], [place] - [address] [place] [address] Phone: [phone] Fax: [phone]  Is this the correct pharmacy for this prescription? Yes If no, delete pharmacy and type the correct one.   Has the prescription been filled recently? Yes  Is the patient out of the medication? No.  Has the patient been seen for an appointment in the last year OR does the patient have an upcoming appointment? Yes  Can we respond through MyChart? Yes  Agent: Please be advised that Rx refills may take up to 3 business days. We ask that you follow-up with your pharmacy.

## 2024-05-10 MED ORDER — FUROSEMIDE 20 MG PO TABS: 20.0000 mg | ORAL_TABLET | Freq: Every day | ORAL | 5 refills | Status: AC

## 2024-05-10 MED ORDER — OXYCODONE HCL 5 MG PO TABS: 5.0000 mg | ORAL_TABLET | ORAL | 0 refills | Status: AC | PRN

## 2024-05-10 NOTE — Telephone Encounter (Signed)
 Requested Prescriptions  Pending Prescriptions Disp Refills   oxyCODONE  (OXY IR/ROXICODONE ) 5 MG immediate release tablet 84 tablet 0    Sig: Take 1 tablet (5 mg total) by mouth every 4 (four) hours as needed for severe pain (pain score 7-10).     Not Delegated - Analgesics:  Opioid Agonists Failed - 05/10/2024  9:53 AM      Failed - This refill cannot be delegated      Failed - Urine Drug Screen completed in last 360 days      Passed - Valid encounter within last 3 months    Recent Outpatient Visits           1 month ago Annual physical exam   [place] [facility] [place], Marsa PARAS, DO   7 months ago Abnormal glucose   [place] [facility], Marsa PARAS, DO               furosemide  (LASIX ) 20 MG tablet 30 tablet 5    Sig: Take 1-2 tablets (20-40 mg total) by mouth daily.     Cardiovascular:  Diuretics - Loop Failed - 05/10/2024  9:53 AM      Failed - Mg Level in normal range and within 180 days    No results found for: MG       Failed - Last BP in normal range    BP Readings from Last 1 Encounters:  03/31/24 (!) 138/94         Passed - K in normal range and within 180 days    Potassium  Date Value Ref Range Status  03/22/2024 4.8 3.5 - 5.3 mmol/L Final         Passed - Ca in normal range and within 180 days    Calcium  Date Value Ref Range Status  03/22/2024 9.5 8.6 - 10.3 mg/dL Final         Passed - Na in normal range and within 180 days    Sodium  Date Value Ref Range Status  03/22/2024 140 135 - 146 mmol/L Final         Passed - Cr in normal range and within 180 days    Creat  Date Value Ref Range Status  03/22/2024 0.98 0.70 - 1.30 mg/dL Final         Passed - Cl in normal range and within 180 days    Chloride  Date Value Ref Range Status  03/22/2024 103 98 - 110 mmol/L Final         Passed - Valid encounter within last 6 months    Recent Outpatient Visits           1 month ago  Annual physical exam   [place] [facility] [place] Marsa PARAS, DO   7 months ago Abnormal glucose    [facility] [place], Marsa PARAS, DO              Refused Prescriptions Disp Refills   gabapentin  (NEURONTIN ) 300 MG capsule 180 capsule 1    Sig: Take 2 capsules (600 mg total) by mouth 3 (three) times daily as needed (pain).     Neurology: Anticonvulsants - gabapentin  Passed - 05/10/2024  9:53 AM      Passed - Cr in normal range and within 360 days    Creat  Date Value Ref Range Status  03/22/2024 0.98 0.70 - 1.30 mg/dL Final  Passed - Completed PHQ-2 or PHQ-9 in the last 360 days      Passed - Valid encounter within last 12 months    Recent Outpatient Visits           1 month ago Annual physical exam   [place] [facility] [place] Marsa PARAS, DO   7 months ago Abnormal glucose   [place] [facility] [place], Marsa PARAS, [place]

## 2024-05-10 NOTE — Telephone Encounter (Signed)
 Requested medication (s) are due for refill today - yess  Requested medication (s) are on the active medication list -yes  Future visit scheduled -yes  Last refill: 03/31/24 #84  Notes to clinic: non delegated Rx  Requested Prescriptions  Pending Prescriptions Disp Refills   oxyCODONE  (OXY IR/ROXICODONE ) 5 MG immediate release tablet 84 tablet 0    Sig: Take 1 tablet (5 mg total) by mouth every 4 (four) hours as needed for severe pain (pain score 7-10).     Not Delegated - Analgesics:  Opioid Agonists Failed - 05/10/2024  9:53 AM      Failed - This refill cannot be delegated      Failed - Urine Drug Screen completed in last 360 days      Passed - Valid encounter within last 3 months    Recent Outpatient Visits           1 month ago Annual physical exam   [place] [facility] [place], Marsa PARAS, DO   7 months ago Abnormal glucose   [place] [facility] [place], Marsa PARAS, DO              Signed Prescriptions Disp Refills   furosemide  (LASIX ) 20 MG tablet 30 tablet 5    Sig: Take 1-2 tablets (20-40 mg total) by mouth daily.     Cardiovascular:  Diuretics - Loop Failed - 05/10/2024  9:53 AM      Failed - Mg Level in normal range and within 180 days    No results found for: MG       Failed - Last BP in normal range    BP Readings from Last 1 Encounters:  03/31/24 (!) 138/94         Passed - K in normal range and within 180 days    Potassium  Date Value Ref Range Status  03/22/2024 4.8 3.5 - 5.3 mmol/L Final         Passed - Ca in normal range and within 180 days    Calcium  Date Value Ref Range Status  03/22/2024 9.5 8.6 - 10.3 mg/dL Final         Passed - Na in normal range and within 180 days    Sodium  Date Value Ref Range Status  03/22/2024 140 135 - 146 mmol/L Final         Passed - Cr in normal range and within 180 days    Creat  Date Value Ref Range Status  03/22/2024 0.98 0.70 - 1.30 mg/dL  Final         Passed - Cl in normal range and within 180 days    Chloride  Date Value Ref Range Status  03/22/2024 103 98 - 110 mmol/L Final         Passed - Valid encounter within last 6 months    Recent Outpatient Visits           1 month ago Annual physical exam   [place] [facility] [place] PARAS, DO   7 months ago Abnormal glucose    [facility] [place], Marsa PARAS, DO              Refused Prescriptions Disp Refills   gabapentin  (NEURONTIN ) 300 MG capsule 180 capsule 1    Sig: Take 2 capsules (600 mg total) by mouth 3 (three) times daily as needed (pain).     Neurology: Anticonvulsants -  gabapentin  Passed - 05/10/2024  9:53 AM      Passed - Cr in normal range and within 360 days    Creat  Date Value Ref Range Status  03/22/2024 0.98 0.70 - 1.30 mg/dL Final         Passed - Completed PHQ-2 or PHQ-9 in the last 360 days      Passed - Valid encounter within last 12 months    Recent Outpatient Visits           1 month ago Annual physical exam   [place] [facility] [place], Marsa PARAS, DO   7 months ago Abnormal glucose   [place] [facility] [place]a PARAS, DO                 Requested Prescriptions  Pending Prescriptions Disp Refills   oxyCODONE  (OXY IR/ROXICODONE ) 5 MG immediate release tablet 84 tablet 0    Sig: Take 1 tablet (5 mg total) by mouth every 4 (four) hours as needed for severe pain (pain score 7-10).     Not Delegated - Analgesics:  Opioid Agonists Failed - 05/10/2024  9:53 AM      Failed - This refill cannot be delegated      Failed - Urine Drug Screen completed in last 360 days      Passed - Valid encounter within last 3 months    Recent Outpatient Visits           1 month ago Annual physical exam   [place] [facility] [place], Marsa PARAS, DO   7 months ago Abnormal glucose   [place]  [facility] [place], Marsa PARAS, DO              Signed Prescriptions Disp Refills   furosemide  (LASIX ) 20 MG tablet 30 tablet 5    Sig: Take 1-2 tablets (20-40 mg total) by mouth daily.     Cardiovascular:  Diuretics - Loop Failed - 05/10/2024  9:53 AM      Failed - Mg Level in normal range and within 180 days    No results found for: MG       Failed - Last BP in normal range    BP Readings from Last 1 Encounters:  03/31/24 (!) 138/94         Passed - K in normal range and within 180 days    Potassium  Date Value Ref Range Status  03/22/2024 4.8 3.5 - 5.3 mmol/L Final         Passed - Ca in normal range and within 180 days    Calcium  Date Value Ref Range Status  03/22/2024 9.5 8.6 - 10.3 mg/dL Final         Passed - Na in normal range and within 180 days    Sodium  Date Value Ref Range Status  03/22/2024 140 135 - 146 mmol/L Final         Passed - Cr in normal range and within 180 days    Creat  Date Value Ref Range Status  03/22/2024 0.98 0.70 - 1.30 mg/dL Final         Passed - Cl in normal range and within 180 days    Chloride  Date Value Ref Range Status  03/22/2024 103 98 - 110 mmol/L Final         Passed - Valid encounter within last 6 months  Recent Outpatient Visits           1 month ago Annual physical exam   [place] [facility] [place], Marsa PARAS, DO   7 months ago Abnormal glucose   [place] [facility] [place], [place]AS, [place]              Refused Prescriptions Disp Refills   gabapentin  (NEURONTIN ) 300 MG capsule 180 capsule 1    Sig: Take 2 capsules (600 mg total) by mouth 3 (three) times daily as needed (pain).     Neurology: Anticonvulsants - gabapentin  Passed - 05/10/2024  9:53 AM      Passed - Cr in normal range and within 360 days    Creat  Date Value Ref Range Status  03/22/2024 0.98 0.70 - 1.30 mg/dL Final         Passed - Completed PHQ-2 or  PHQ-9 in the last 360 days      Passed - Valid encounter within last 12 months    Recent Outpatient Visits           1 month ago Annual physical exam   [place] [facility] [place] PARAS, [place]   7 months ago Abnormal glucose   [place] [facility] [place], [place]AS, [place]

## 2024-05-16 ENCOUNTER — Telehealth: Payer: Self-pay

## 2024-05-16 NOTE — Telephone Encounter (Signed)
 Patient has requested to reschedule his 05/17/24 colonoscopy with Dr. Melany due to road conditions, and his child's school schedule.  Colonoscopy has been rescheduled to 06/14/24.  Secure message sent to Luke to move procedure.  Message routed to [place] to make her aware.  Thanks,  [place], CMA

## 2024-06-14 ENCOUNTER — Encounter: Admission: RE | Payer: Self-pay | Source: Home / Self Care

## 2024-06-14 ENCOUNTER — Ambulatory Visit: Admission: RE | Admit: 2024-06-14 | Source: Home / Self Care | Admitting: Gastroenterology

## 2024-06-29 ENCOUNTER — Ambulatory Visit: Admitting: Family Medicine

## 2024-09-12 ENCOUNTER — Ambulatory Visit (INDEPENDENT_AMBULATORY_CARE_PROVIDER_SITE_OTHER): Admitting: Nurse Practitioner
